# Patient Record
Sex: Female | Born: 1965
Health system: Southern US, Community
[De-identification: ages and names within clinical notes are randomized; demographics above are authoritative.]

## PROBLEM LIST (undated history)

## (undated) DIAGNOSIS — Z923 Personal history of irradiation: Secondary | ICD-10-CM

## (undated) DIAGNOSIS — I10 Essential (primary) hypertension: Secondary | ICD-10-CM

## (undated) DIAGNOSIS — C50919 Malignant neoplasm of unspecified site of unspecified female breast: Secondary | ICD-10-CM

## (undated) DIAGNOSIS — Z9221 Personal history of antineoplastic chemotherapy: Secondary | ICD-10-CM

## (undated) HISTORY — PX: PORTACATH PLACEMENT: SHX2246

---

## 2016-01-12 ENCOUNTER — Other Ambulatory Visit: Payer: Self-pay | Admitting: Family Medicine

## 2016-01-12 DIAGNOSIS — N63 Unspecified lump in unspecified breast: Secondary | ICD-10-CM

## 2016-01-19 ENCOUNTER — Other Ambulatory Visit: Payer: Self-pay | Admitting: Family Medicine

## 2016-01-19 ENCOUNTER — Ambulatory Visit
Admission: RE | Admit: 2016-01-19 | Discharge: 2016-01-19 | Disposition: A | Discharge status: Home / Self Care | Payer: Managed Care, Other (non HMO) | Source: Ambulatory Visit | Attending: Family Medicine | Admitting: Family Medicine

## 2016-01-19 DIAGNOSIS — N6310 Unspecified lump in the right breast, unspecified quadrant: Secondary | ICD-10-CM

## 2016-01-19 DIAGNOSIS — N63 Unspecified lump in unspecified breast: Secondary | ICD-10-CM

## 2016-01-20 ENCOUNTER — Ambulatory Visit
Admission: RE | Admit: 2016-01-20 | Discharge: 2016-01-20 | Disposition: A | Discharge status: Home / Self Care | Payer: Managed Care, Other (non HMO) | Source: Ambulatory Visit | Attending: Family Medicine | Admitting: Family Medicine

## 2016-01-20 ENCOUNTER — Other Ambulatory Visit: Payer: Self-pay | Admitting: Family Medicine

## 2016-01-20 DIAGNOSIS — N6310 Unspecified lump in the right breast, unspecified quadrant: Secondary | ICD-10-CM

## 2016-01-20 DIAGNOSIS — C50919 Malignant neoplasm of unspecified site of unspecified female breast: Secondary | ICD-10-CM

## 2016-01-20 HISTORY — DX: Malignant neoplasm of unspecified site of unspecified female breast: C50.919

## 2016-01-20 HISTORY — PX: BREAST BIOPSY: SHX20

## 2016-01-26 ENCOUNTER — Ambulatory Visit: Payer: Self-pay | Admitting: Surgery

## 2016-01-26 NOTE — H&P (Signed)
Elizabeth Cunningham 01/26/2016 2:13 PM Location: [facility] Patient #: [id number] DOB: 1966-07-04 Married / Language: [name] / Race: White Female  History of Present Illness [name] A. Amarachi Kotz MD; 01/26/2016 3:55 PM) Patient words: . Patient sent at the request of Dr. Augustin Coupe of the [facility] due to a right breast mass. This is been present for the last 6-7 weeks. Location is in the upper central right breast. It is nontender. Patient workup which included mammography with ultrasound which shows a 3 cm mass upper central breast on the right. Core biopsy was done which shows invasive ductal carcinoma ER negative PR negative HER-2/neu pending. She is a Ki-67 of 95%. Patient denies any history of breast cancer. She has no other complaints today.             ADDITIONAL INFORMATION: PROGNOSTIC INDICATORS Results: IMMUNOHISTOCHEMICAL AND MORPHOMETRIC ANALYSIS PERFORMED MANUALLY Estrogen Receptor: 0%, NEGATIVE Progesterone Receptor: 0%, NEGATIVE Proliferation Marker Ki67: 95% COMMENT: The negative hormone receptor study(ies) in this case has no internal positive control. REFERENCE RANGE ESTROGEN RECEPTOR NEGATIVE 0% POSITIVE =>1% REFERENCE RANGE PROGESTERONE RECEPTOR NEGATIVE 0% POSITIVE =>1% All controls stained appropriately Enid Cutter MD Pathologist, Electronic Signature ( Signed 01/24/2016) FINAL DIAGNOSIS Diagnosis Breast, right, needle core biopsy, 12:00 o'clock - INVASIVE DUCTAL CARCINOMA. - SEE COMMENT. 1 of 2 FINAL for Elizabeth Cunningham, Elizabeth Cunningham ([id number]) Microscopic Comment Although definitive grading of breast carcinoma is best done on excision, the features of the invasive tumor from the right breast 12 o'clock biopsy are compatible with a grade 3 breast carcinoma. Of note, there are features which raise concern for lymph/vascular invasion. Breast prognostic markers will be performed and reported in an addendum. Findings are called to the  [facility] on 01/23/16. Dr. Lyndon Code has seen this case in consultation with agreement. (RAH:gt, 01/23/16) Willeen Niece MD Pathologist, Electronic Signature     CLINICAL DATA: Palpable abnormality in the right breast. EXAM: 2D DIGITAL DIAGNOSTIC BILATERAL MAMMOGRAM WITH ADJUNCT TOMO ULTRASOUND RIGHT BREAST COMPARISON: 10/10/2015 ACR Breast Density Category c: The breast tissue is heterogeneously dense, which may obscure small masses. FINDINGS: A BB is placed in the area of patient's concern in the upper central portion of the right breast. Within this region there is irregular mass, partially obscured and further evaluated with ultrasound. Left breast is negative. On physical exam, I palpate a mass in the 12-12 30 o'clock location of the right breast, approximately 3 cm in diameter on physical exam. Targeted ultrasound is performed, showing an irregular hypoechoic mass in the 12:30 o'clock location of the right breast measuring 2.9 x 1.9 by 1.6 cm. Mass is taller than wide and contain significant vascularity on Doppler evaluation. Evaluation of the axilla is negative for adenopathy. IMPRESSION: Suspicious mass in the 12:30 o'clock location of the right breast. Ultrasound-guided core biopsy is recommended. No evidence for adenopathy. RECOMMENDATION: Biopsy is scheduled for the patient on 01/20/2016. I have discussed the findings and recommendations with the patient. Results were also provided in writing at the conclusion of the visit. If applicable, a reminder letter will be sent to the patient regarding the next appointment. BI-RADS CATEGORY 4: Suspicious. Electronically Signed By: Nolon Nations M.D. On: 01/19/2016 13:07 .  The patient is a 50 year old female.   Other Problems Elbert Ewings, CMA; 01/26/2016 2:13 PM) Breast Cancer High blood pressure Lump In Breast  Past Surgical History Elbert Ewings, CMA; 01/26/2016 2:13 PM) Breast Biopsy  Right.  Diagnostic Studies History Elbert Ewings, [place]; 01/26/2016 2:13 PM)  Colonoscopy never Mammogram within last year Pap Smear 1-5 years ago  Allergies Elbert Ewings, CMA; 01/26/2016 2:14 PM) No Known Drug Allergies 01/26/2016  Medication History Elbert Ewings, CMA; 01/26/2016 2:14 PM) Atenolol (25MG Tablet, Oral) Active. Multiple Vitamin (Oral) Active. Omega 3 (1000MG Capsule, Oral) Active. Medications Reconciled  Social History Elbert Ewings, [place]; 01/26/2016 2:13 PM) Alcohol use Occasional alcohol use. Caffeine use Coffee. No drug use Tobacco use Never smoker.  Pregnancy / Birth History Elbert Ewings, CMA; 01/26/2016 2:13 PM) Age at menarche 89 years.     Review of Systems Elbert Ewings CMA; 01/26/2016 2:13 PM) General Not Present- Appetite Loss, Chills, Fatigue, Fever, Night Sweats, Weight Gain and Weight Loss. Skin Not Present- Change in Wart/Mole, Dryness, Hives, Jaundice, New Lesions, Non-Healing Wounds, Rash and Ulcer. HEENT Present- Wears glasses/contact lenses. Not Present- Earache, Hearing Loss, Hoarseness, Nose Bleed, Oral Ulcers, Ringing in the Ears, Seasonal Allergies, Sinus Pain, Sore Throat, Visual Disturbances and Yellow Eyes. Respiratory Not Present- Bloody sputum, Chronic Cough, Difficulty Breathing, Snoring and Wheezing. Breast Present- Breast Mass. Not Present- Breast Pain, Nipple Discharge and Skin Changes. Cardiovascular Not Present- Chest Pain, Difficulty Breathing Lying Down, Leg Cramps, Palpitations, Rapid Heart Rate, Shortness of Breath and Swelling of Extremities. Gastrointestinal Not Present- Abdominal Pain, Bloating, Bloody Stool, Change in Bowel Habits, Chronic diarrhea, Constipation, Difficulty Swallowing, Excessive gas, Gets full quickly at meals, Hemorrhoids, Indigestion, Nausea, Rectal Pain and Vomiting. Female Genitourinary Not Present- Frequency, Nocturia, Painful Urination, Pelvic Pain and Urgency. Musculoskeletal Not Present- Back Pain,  Joint Pain, Joint Stiffness, Muscle Pain, Muscle Weakness and Swelling of Extremities. Neurological Not Present- Decreased Memory, Fainting, Headaches, Numbness, Seizures, Tingling, Tremor, Trouble walking and Weakness. Psychiatric Not Present- Anxiety, Bipolar, Change in Sleep Pattern, Depression, Fearful and Frequent crying. Endocrine Present- Hot flashes. Not Present- Cold Intolerance, Excessive Hunger, Hair Changes, Heat Intolerance and New Diabetes. Hematology Not Present- Easy Bruising, Excessive bleeding, Gland problems, HIV and Persistent Infections.  Vitals Elbert Ewings CMA; 01/26/2016 2:15 PM) 01/26/2016 2:14 PM Weight: 144.8 lb Height: 65in Body Surface Area: 1.72 m Body Mass Index: 24.1 kg/m  Temp.: 97.45F  Pulse: 80 (Regular)  BP: 122/68 (Sitting, Left Arm, Standard)      Physical Exam (Bannon Giammarco A. Jowanna Loeffler MD; 01/26/2016 3:56 PM)  General Mental Status-Alert. General Appearance-Consistent with stated age. Hydration-Well hydrated. Voice-Normal.  Head and Neck Head-normocephalic, atraumatic with no lesions or palpable masses. Trachea-midline. Thyroid Gland Characteristics - normal size and consistency.  Chest and Lung Exam Chest and lung exam reveals -quiet, even and easy respiratory effort with no use of accessory muscles and on auscultation, normal breath sounds, no adventitious sounds and normal vocal resonance. Inspection Chest Wall - Normal. Back - normal.  Breast Note: Right breast shows 3 cm mobile mass central upper breast. No other masses. Left breast shows no mass lesion. There is no evidence of nipple discharge bilaterally.  Cardiovascular Cardiovascular examination reveals -normal heart sounds, regular rate and rhythm with no murmurs and normal pedal pulses bilaterally.  Musculoskeletal Normal Exam - Left-Upper Extremity Strength Normal and Lower Extremity Strength Normal. Normal Exam - Right-Upper Extremity Strength  Normal and Lower Extremity Strength Normal.  Lymphatic Axillary  General Axillary Region: Bilateral - Description - Normal. Tenderness - Non Tender.    Assessment & Plan (Shemeca Lukasik A. Fredrica Capano MD; 01/26/2016 3:58 PM)  BREAST CANCER, RIGHT (C50.911) Impression: Discussed treatment options with the patient to include breast conservation versus mastectomy with reconstruction. She is triple negative more than likely and may need medical oncology input  prior to any surgery. She may need postoperative chemotherapy as well. She is a candidate at this point for lumpectomy with sentinel lymph node mapping with minimal cosmetic deformity noted. She may be a better candidate for neoadjuvant chemotherapy upfront and subsequent surgery down the road. I discussed the role of Port-A-Cath placement with her as well. A wedge of the risk of this as well as lumpectomy with sentinel lymph node mapping as well. Also discussed vasectomies of treatment option with reconstruction with potential nipple preservation. She will see medical radiation collagen and future plans will be determined once these consultants of seeing her and have given their opinions Risk of lumpectomy include bleeding, infection, seroma, more surgery, use of seed/wire, wound care, cosmetic deformity and the need for other treatments, death , blood clots, death. Pt agrees to proceed. Risk of sentinel lymph node mapping include bleeding, infection, lymphedema, shoulder pain. stiffness, dye allergy. cosmetic deformity , blood clots, death, need for more surgery. Pt agres to proceed. Pt requires port placement for chemotherapy. Risk include bleeding, infection, pneumothorax, hemothorax, mediastinal injury, nerve injury , blood vessel injury, strke, blood clots, death, migration. embolization and need for additional procedures. Pt agrees to proceed.  Current Plans Referred to Oncology, for evaluation and follow up (Oncology). Routine. Referred to Radiation  Oncology, for evaluation and follow up (Radiation Oncology). Routine. Pt Education - [facility] Breast Cancer Information Given - [facility] "Breast Journey" Package We discussed the staging and pathophysiology of breast cancer. We discussed all of the different options for treatment for breast cancer including surgery, chemotherapy, radiation therapy, Herceptin, and antiestrogen therapy. We discussed a sentinel lymph node biopsy as she does not appear to having lymph node involvement right now. We discussed the performance of that with injection of radioactive tracer and blue dye. We discussed that she would have an incision underneath her axillary hairline. We discussed that there is a bout a 10-20% chance of having a positive node with a sentinel lymph node biopsy and we will await the permanent pathology to make any other first further decisions in terms of her treatment. One of these options might be to return to the operating room to perform an axillary lymph node dissection. We discussed about a 1-2% risk lifetime of chronic shoulder pain as well as lymphedema associated with a sentinel lymph node biopsy. We discussed the options for treatment of the breast cancer which included lumpectomy versus a mastectomy. We discussed the performance of the lumpectomy with a wire placement. We discussed a 10-20% chance of a positive margin requiring reexcision in the operating room. We also discussed that she may need radiation therapy or antiestrogen therapy or both if she undergoes lumpectomy. We discussed the mastectomy and the postoperative care for that as well. We discussed that there is no difference in her survival whether she undergoes lumpectomy with radiation therapy or antiestrogen therapy versus a mastectomy. There is a slight difference in the local recurrence rate being 3-5% with lumpectomy and about 1% with a mastectomy. We discussed the risks of operation including bleeding, infection, possible reoperation.  She understands her further therapy will be based on what her stages at the time of her operation.  Pt Education - [facility] breast cancer surgery: discussed with patient and provided information. Pt Education - [facility] Breast Biopsy HCI: discussed with patient and provided information. Pt Education - [facility] Mastectomy HCI Pt Education - ABC (After Breast Cancer) Class Info: discussed with patient and provided information.

## 2016-01-27 ENCOUNTER — Encounter: Payer: Self-pay | Admitting: Hematology and Oncology

## 2016-01-27 ENCOUNTER — Telehealth: Payer: Self-pay | Admitting: Hematology and Oncology

## 2016-01-27 ENCOUNTER — Encounter: Payer: Self-pay | Admitting: Radiation Oncology

## 2016-01-27 NOTE — Telephone Encounter (Signed)
Spoke with patient re new patient appointment with [facility] 01/30/2016 @ 1 pm to arrive 12:30 pm. Patient demographic and insurance information confirmed.

## 2016-01-30 ENCOUNTER — Encounter: Payer: Self-pay | Admitting: *Deleted

## 2016-01-30 ENCOUNTER — Ambulatory Visit: Payer: Self-pay | Admitting: Surgery

## 2016-01-30 ENCOUNTER — Encounter: Payer: Self-pay | Admitting: Hematology and Oncology

## 2016-01-30 ENCOUNTER — Ambulatory Visit (HOSPITAL_BASED_OUTPATIENT_CLINIC_OR_DEPARTMENT_OTHER): Payer: Managed Care, Other (non HMO) | Admitting: Hematology and Oncology

## 2016-01-30 ENCOUNTER — Telehealth: Payer: Self-pay | Admitting: Hematology and Oncology

## 2016-01-30 VITALS — BP 167/90 | HR 78 | Temp 99.2°F | Resp 18 | Ht 65.0 in | Wt 145.9 lb

## 2016-01-30 DIAGNOSIS — C50211 Malignant neoplasm of upper-inner quadrant of right female breast: Secondary | ICD-10-CM | POA: Diagnosis not present

## 2016-01-30 MED ORDER — LORAZEPAM 0.5 MG PO TABS: 0.5000 mg | ORAL_TABLET | Freq: Every day | ORAL | Status: DC

## 2016-01-30 MED ORDER — PROCHLORPERAZINE MALEATE 10 MG PO TABS: 10.0000 mg | ORAL_TABLET | Freq: Four times a day (QID) | ORAL | Status: DC | PRN

## 2016-01-30 MED ORDER — ONDANSETRON HCL 8 MG PO TABS: 8.0000 mg | ORAL_TABLET | Freq: Two times a day (BID) | ORAL | Status: DC | PRN

## 2016-01-30 MED ORDER — DEXAMETHASONE 4 MG PO TABS: 4.0000 mg | ORAL_TABLET | Freq: Every day | ORAL | Status: DC

## 2016-01-30 MED ORDER — LIDOCAINE-PRILOCAINE 2.5-2.5 % EX CREA: TOPICAL_CREAM | CUTANEOUS | Status: DC

## 2016-01-30 NOTE — Progress Notes (Signed)
Information for PREVENT trial given to pt for review.  Pt instructed that a research RN will be contacting her.  Should pt have any questions in the mean time, pt informed to call us at any time.  No further questions or concerns at time of discharge.

## 2016-01-30 NOTE — Progress Notes (Signed)
[facility] CONSULT NOTE  Consulting physician Dr. Brantley Stage CHIEF COMPLAINTS/PURPOSE OF CONSULTATION:  Newly diagnosed breast cancer  HISTORY OF PRESENTING ILLNESS:  Elizabeth Cunningham 50 y.o. female is here because of recent diagnosis of right breast cancer. Patient had abnormal routine screening mammogram in December 2016. She subsequently felt a lump in the right breast and came back for another set of mammograms at the request of Dr. Augustin Coupe. This set up a mammogram and ultrasound that revealed a 2.9 cm mass in the right breast. Core biopsy done revealed invasive ductal carcinoma that was triple negative with a Ki-67 of 95%. She was seen by Dr. Brantley Stage and is here today to discuss treatment plan. She works in the billing department previously and her husband works at [employer], Building services engineer. He used to work for [employer].  I reviewed her records extensively and collaborated the history with the patient.  SUMMARY OF ONCOLOGIC HISTORY:   Breast cancer of upper-inner quadrant of right female breast ([place])   01/20/2016 Initial Diagnosis Right breast biopsy 12:00: Invasive ductal carcinoma, grade 3, lymphovascular invasion is present, ER 0%, PR 0%, Ki-67 95%, HER-2 negative ratio 1.19; mamm and [place] irregular mass 2.9 x 1.9 x 1.6 cm, no axillary LN T2 N0 stage IIA clinical stage   MEDICAL HISTORY:  Hypertension SURGICAL HISTORY: No prior surgeries SOCIAL HISTORY:denies any tobacco alcohol or recreational drug use. They moved from [place] to [place] due to her husband's work.Previously she worked in Press photographer. FAMILY HISTORY: Patient was adopted and hence there is no family history available ALLERGIES:  has no allergies on file. MEDICATIONS:  Atenolol 25 mg daily, multivitamin 1 tablet daily, omega-3 1000 mg capsule daily  REVIEW OF SYSTEMS:   Constitutional: Denies fevers, chills or abnormal night sweats Eyes: Denies blurriness of vision, double vision or watery  eyes Ears, nose, mouth, throat, and face: Denies mucositis or sore throat Respiratory: Denies cough, dyspnea or wheezes Cardiovascular: Denies palpitation, chest discomfort or lower extremity swelling Gastrointestinal:  Denies nausea, heartburn or change in bowel habits Skin: Denies abnormal skin rashes Lymphatics: Denies new lymphadenopathy or easy bruising Neurological:Denies numbness, tingling or new weaknesses Behavioral/Psych: Mood is stable, no new changes  Breast: Palpable lump in the right breast upper outer quadrant All other systems were reviewed with the patient and are negative.  PHYSICAL EXAMINATION: ECOG PERFORMANCE STATUS: 0 - Asymptomatic  Filed Vitals:   01/30/16 1337  BP: 167/90  Pulse: 78  Temp: 99.2 F (37.3 C)  Resp: 18   Filed Weights   01/30/16 1337  Weight: 145 lb 14.4 oz (66.18 kg)    GENERAL:alert, no distress and comfortable SKIN: skin color, texture, turgor are normal, no rashes or significant lesions EYES: normal, conjunctiva are pink and non-injected, sclera clear OROPHARYNX:no exudate, no erythema and lips, buccal mucosa, and tongue normal  NECK: supple, thyroid normal size, non-tender, without nodularity LYMPH:  no palpable lymphadenopathy in the cervical, axillary or inguinal LUNGS: clear to auscultation and percussion with normal breathing effort HEART: regular rate & rhythm and no murmurs and no lower extremity edema ABDOMEN:abdomen soft, non-tender and normal bowel sounds Musculoskeletal:no cyanosis of digits and no clubbing  PSYCH: alert & oriented x 3 with fluent speech NEURO: no focal motor/sensory deficits BREAST:arge palpable lump in the right breast upper outer quadrant with small hematoma. No palpable axillary or supraclavicular lymphadenopathy (exam performed in the presence of a chaperone)   RADIOGRAPHIC STUDIES: I have personally reviewed the radiological reports and agreed with the findings  in the report.  ASSESSMENT AND  PLAN:  Breast cancer of upper-inner quadrant of right female breast ([place]) Right breast biopsy 01/20/2016 12:00: Invasive ductal carcinoma, grade 3, lymphovascular invasion is present, ER 0%, PR 0%, Ki-67 95%, HER-2 negative ratio 1.19; mamm and [place] irregular mass 2.9 x 1.9 x 1.6 cm, no axillary LN T2 N0 stage IIA clinical stage  Pathology and radiology counseling: Discussed with the patient, the details of pathology including the type of breast cancer,the clinical staging, the significance of ER, PR and HER-2/neu receptors and the implications for treatment. After reviewing the pathology in detail, we proceeded to discuss the different treatment options between surgery, radiation, chemotherapy, antiestrogen therapies.  Recommendation based on multidisciplinary tumor board: 1. Neoadjuvant chemotherapy with Adriamycin and Cytoxan dose dense 4 followed by Abraxane weekly 12 2. Followed by breast conserving surgery with sentinel lymph node study vs bilateral mastectomies. (patient appears to be very worried that the mammograms did not pick up her breast cancer with mammograms may not be sufficient) 3. Followed by adjuvant radiation therapy 4. Genetic counseling and testing  Chemotherapy Counseling: I discussed the risks and benefits of chemotherapy including the risks of nausea/ vomiting, risk of infection from low WBC count, fatigue due to chemo or anemia, bruising or bleeding due to low platelets, mouth sores, loss/ change in taste and decreased appetite. Liver and kidney function will be monitored through out chemotherapy as abnormalities in liver and kidney function may be a side effect of treatment. Cardiac dysfunction due to Adriamycin was discussed in detail. Risk of permanent bone marrow dysfunction due to chemo were also discussed.  Plan: 1. Port placement 2. Echocardiogram 3. Chemotherapy class 4. Breast MRI Genetic counseling will also be arranged   PREVENT trial: [id number] [phone]  Newly  diagnosed breast cancer Stages 1-3 scheduled to receive anthracycline randomized to atorvastatin 40 mg or placebo once daily for 24 months along with 3 cardiac MRIs or 2 years paid for by the trial. I discussed the risks and benefits of atorvastatin including the risk of myopathy and elevation of liver function tests. I explained the randomization process and patient is interested in the trial. I provided her with literature to read and provided her with the contact with the clinical trials nurse to ask any questions regarding the trial.  Return to clinic with day 1 of chemotherapy. All questions were answered. The patient knows to call the clinic with any problems, questions or concerns.    Rulon Eisenmenger, MD 01/30/2016

## 2016-01-30 NOTE — Telephone Encounter (Signed)
appt made and avs printed. Ov/mri and echo to be schedulaed

## 2016-01-30 NOTE — Assessment & Plan Note (Signed)
Right breast biopsy 01/20/2016 12:00: Invasive ductal carcinoma, grade 3, lymphovascular invasion is present, ER 0%, PR 0%, Ki-67 95%, HER-2 negative ratio 1.19; mamm and [place] irregular mass 2.9 x 1.9 x 1.6 cm, no axillary LN T2 N0 stage IIA clinical stage  Pathology and radiology counseling: Discussed with the patient, the details of pathology including the type of breast cancer,the clinical staging, the significance of ER, PR and HER-2/neu receptors and the implications for treatment. After reviewing the pathology in detail, we proceeded to discuss the different treatment options between surgery, radiation, chemotherapy, antiestrogen therapies.  Recommendation based on multidisciplinary tumor board: 1. Neoadjuvant chemotherapy with Adriamycin and Cytoxan dose dense 4 followed by Abraxane weekly 12 2. Followed by breast conserving surgery with sentinel lymph node study vs targeted axillary dissection 3. Followed by adjuvant radiation therapy 4. Genetic counseling and testing  Chemotherapy Counseling: I discussed the risks and benefits of chemotherapy including the risks of nausea/ vomiting, risk of infection from low WBC count, fatigue due to chemo or anemia, bruising or bleeding due to low platelets, mouth sores, loss/ change in taste and decreased appetite. Liver and kidney function will be monitored through out chemotherapy as abnormalities in liver and kidney function may be a side effect of treatment. Cardiac dysfunction due to Adriamycin was discussed in detail. Risk of permanent bone marrow dysfunction due to chemo were also discussed.  Plan: 1. Port placement 2. Echocardiogram 3. Chemotherapy class 4. Breast MRI Genetic counseling will also be arranged   PREVENT trial: [id number] [phone]  Newly diagnosed breast cancer Stages 1-3 scheduled to receive anthracycline randomized to atorvastatin 40 mg or placebo once daily for 24 months along with 3 cardiac MRIs or 2 years paid for by the trial. I  discussed the risks and benefits of atorvastatin including the risk of myopathy and elevation of liver function tests. I explained the randomization process and patient is interested in the trial. I provided her with literature to read and provided her with the contact with the clinical trials nurse to ask any questions regarding the trial.  Return to clinic with day 1 of chemotherapy.

## 2016-01-31 ENCOUNTER — Other Ambulatory Visit: Payer: Self-pay

## 2016-01-31 ENCOUNTER — Encounter: Payer: Managed Care, Other (non HMO) | Admitting: *Deleted

## 2016-01-31 ENCOUNTER — Other Ambulatory Visit: Payer: Self-pay | Admitting: *Deleted

## 2016-01-31 ENCOUNTER — Telehealth: Payer: Self-pay | Admitting: *Deleted

## 2016-01-31 ENCOUNTER — Encounter: Payer: Self-pay | Admitting: *Deleted

## 2016-01-31 ENCOUNTER — Other Ambulatory Visit (HOSPITAL_COMMUNITY): Payer: Self-pay | Admitting: Surgery

## 2016-01-31 ENCOUNTER — Other Ambulatory Visit: Payer: Managed Care, Other (non HMO)

## 2016-01-31 DIAGNOSIS — C50211 Malignant neoplasm of upper-inner quadrant of right female breast: Secondary | ICD-10-CM

## 2016-01-31 DIAGNOSIS — C50911 Malignant neoplasm of unspecified site of right female breast: Secondary | ICD-10-CM

## 2016-01-31 MED ORDER — LIDOCAINE-PRILOCAINE 2.5-2.5 % EX CREA: TOPICAL_CREAM | CUTANEOUS | Status: DC

## 2016-01-31 MED ORDER — LORAZEPAM 0.5 MG PO TABS: 0.5000 mg | ORAL_TABLET | Freq: Every day | ORAL | Status: DC

## 2016-01-31 MED ORDER — DEXAMETHASONE 4 MG PO TABS: 4.0000 mg | ORAL_TABLET | Freq: Every day | ORAL | Status: DC

## 2016-01-31 MED ORDER — ONDANSETRON HCL 8 MG PO TABS: 8.0000 mg | ORAL_TABLET | Freq: Two times a day (BID) | ORAL | Status: DC | PRN

## 2016-01-31 MED ORDER — PROCHLORPERAZINE MALEATE 10 MG PO TABS: 10.0000 mg | ORAL_TABLET | Freq: Four times a day (QID) | ORAL | Status: DC | PRN

## 2016-01-31 NOTE — Telephone Encounter (Signed)
Pt called to relate she is going out of town on 5/25 and will be back on 5/30.  POF sent to move cycle 1 AC to 5/31 with lab and Dr. [name] appt.

## 2016-01-31 NOTE — Telephone Encounter (Signed)
Called pt to find out which pharmacy she preferred.  Notified pt that all prescriptions would be called in today to [facility] and she could pick these up at her convenience.  No further questions from pt at time of call.

## 2016-02-01 ENCOUNTER — Encounter: Payer: Managed Care, Other (non HMO) | Admitting: *Deleted

## 2016-02-01 ENCOUNTER — Other Ambulatory Visit: Payer: Managed Care, Other (non HMO)

## 2016-02-02 ENCOUNTER — Other Ambulatory Visit: Payer: Self-pay | Admitting: Radiology

## 2016-02-02 ENCOUNTER — Telehealth: Payer: Self-pay | Admitting: Hematology and Oncology

## 2016-02-02 ENCOUNTER — Telehealth: Payer: Self-pay | Admitting: *Deleted

## 2016-02-02 ENCOUNTER — Encounter: Payer: Self-pay | Admitting: Hematology and Oncology

## 2016-02-02 NOTE — Telephone Encounter (Signed)
Per staff message and POF I have scheduled appts. Advised scheduler of appts. JMW  

## 2016-02-02 NOTE — Progress Notes (Signed)
Called patient to introduce myself as her Estate manager/land agent and ask if she had any financial questions or concerns. Patient wanted to know if her chemo had been authorized. Advised patient she would be notified if it had not been prior to her treatment. Asked patient if she had met her deductible or OOP. Patient states she had not yet but still had charges that had not been processed through her insurance. Advised her of the [employer] First Step program for Neulasta and how it works and also that she could apply if needed after her other insurance payments process. Gave patient my name and contact number should she have any additional financial questions or concerns.

## 2016-02-02 NOTE — Telephone Encounter (Signed)
Spoke with patient to confirm echo appt 5/22 and ov/lab/tx appt 6/1 at 1115 am

## 2016-02-03 ENCOUNTER — Other Ambulatory Visit (HOSPITAL_COMMUNITY): Payer: Self-pay | Admitting: Surgery

## 2016-02-03 ENCOUNTER — Encounter (HOSPITAL_COMMUNITY): Payer: Self-pay

## 2016-02-03 ENCOUNTER — Ambulatory Visit (HOSPITAL_COMMUNITY)
Admission: RE | Admit: 2016-02-03 | Discharge: 2016-02-03 | Disposition: A | Discharge status: Home / Self Care | Payer: Managed Care, Other (non HMO) | Source: Ambulatory Visit | Attending: Surgery | Admitting: Surgery

## 2016-02-03 DIAGNOSIS — C50911 Malignant neoplasm of unspecified site of right female breast: Secondary | ICD-10-CM

## 2016-02-03 DIAGNOSIS — Z79899 Other long term (current) drug therapy: Secondary | ICD-10-CM | POA: Diagnosis not present

## 2016-02-03 HISTORY — DX: Malignant neoplasm of unspecified site of unspecified female breast: C50.919

## 2016-02-03 LAB — CBC WITH DIFFERENTIAL/PLATELET
Basophils Absolute: 0 10*3/uL (ref 0.0–0.1)
Basophils Relative: 0 %
EOS ABS: 0 10*3/uL (ref 0.0–0.7)
EOS PCT: 0 %
HCT: 36.1 % (ref 36.0–46.0)
Hemoglobin: 12.2 g/dL (ref 12.0–15.0)
LYMPHS ABS: 1.9 10*3/uL (ref 0.7–4.0)
Lymphocytes Relative: 21 %
MCH: 29.6 pg (ref 26.0–34.0)
MCHC: 33.8 g/dL (ref 30.0–36.0)
MCV: 87.6 fL (ref 78.0–100.0)
MONOS PCT: 5 %
Monocytes Absolute: 0.5 10*3/uL (ref 0.1–1.0)
Neutro Abs: 6.6 10*3/uL (ref 1.7–7.7)
Neutrophils Relative %: 74 %
PLATELETS: 284 10*3/uL (ref 150–400)
RBC: 4.12 MIL/uL (ref 3.87–5.11)
RDW: 13.6 % (ref 11.5–15.5)
WBC: 9 10*3/uL (ref 4.0–10.5)

## 2016-02-03 LAB — BASIC METABOLIC PANEL
Anion gap: 11 (ref 5–15)
BUN: 12 mg/dL (ref 6–20)
CHLORIDE: 102 mmol/L (ref 101–111)
CO2: 25 mmol/L (ref 22–32)
CREATININE: 0.68 mg/dL (ref 0.44–1.00)
Calcium: 9.9 mg/dL (ref 8.9–10.3)
GFR calc Af Amer: >60 mL/min (ref >60)
GFR calc non Af Amer: >60 mL/min (ref >60)
Glucose, Bld: 125 mg/dL — ABNORMAL HIGH (ref 65–99)
Potassium: 5.5 mmol/L — ABNORMAL HIGH (ref 3.5–5.1)
SODIUM: 138 mmol/L (ref 135–145)

## 2016-02-03 LAB — PROTIME-INR
INR: 1.06 (ref 0.00–1.49)
PROTHROMBIN TIME: 14 s (ref 11.6–15.2)

## 2016-02-03 MED ORDER — MIDAZOLAM HCL 2 MG/2ML IJ SOLN
INTRAMUSCULAR | Status: AC
  Filled 2016-02-03: qty 4

## 2016-02-03 MED ORDER — CEFAZOLIN SODIUM-DEXTROSE 2-4 GM/100ML-% IV SOLN
2.0000 g | INTRAVENOUS | Status: AC
  Administered 2016-02-03: 2 g via INTRAVENOUS

## 2016-02-03 MED ORDER — CEFAZOLIN SODIUM-DEXTROSE 2-4 GM/100ML-% IV SOLN
INTRAVENOUS | Status: AC
  Filled 2016-02-03: qty 100

## 2016-02-03 MED ORDER — FENTANYL CITRATE (PF) 100 MCG/2ML IJ SOLN
INTRAMUSCULAR | Status: AC | PRN
  Administered 2016-02-03: 25 ug via INTRAVENOUS
  Administered 2016-02-03: 50 ug via INTRAVENOUS
  Administered 2016-02-03: 25 ug via INTRAVENOUS

## 2016-02-03 MED ORDER — MIDAZOLAM HCL 2 MG/2ML IJ SOLN
INTRAMUSCULAR | Status: AC | PRN
  Administered 2016-02-03: 1 mg via INTRAVENOUS
  Administered 2016-02-03: 0.5 mg via INTRAVENOUS
  Administered 2016-02-03: 1 mg via INTRAVENOUS

## 2016-02-03 MED ORDER — SODIUM CHLORIDE 0.9 % IV SOLN
INTRAVENOUS | Status: DC
  Administered 2016-02-03: 12:00:00 via INTRAVENOUS

## 2016-02-03 MED ORDER — LIDOCAINE-EPINEPHRINE (PF) 1 %-1:200000 IJ SOLN
INTRAMUSCULAR | Status: DC
Start: 2016-02-03 — End: 2016-02-04
  Filled 2016-02-03: qty 30

## 2016-02-03 MED ORDER — HEPARIN SOD (PORK) LOCK FLUSH 100 UNIT/ML IV SOLN
INTRAVENOUS | Status: AC
  Filled 2016-02-03: qty 10

## 2016-02-03 MED ORDER — FENTANYL CITRATE (PF) 100 MCG/2ML IJ SOLN
INTRAMUSCULAR | Status: AC
  Filled 2016-02-03: qty 4

## 2016-02-03 NOTE — Progress Notes (Signed)
Location of Breast Cancer:Upper-inner quadrant of right female breast  Histology per Pathology Report: Diagnosis 01-20-16 Breast, right, needle core biopsy, 12:00 o'clock - INVASIVE DUCTAL CARCINOMA. Receptor Status: ER(0), PR (0), Her2-neu (-), Ki-(95%)  Elizabeth Cunningham  felt a lump in the right breast and came back for another set of mammograms.  Past/Anticipated interventions by surgeon, if any: To be scheduled after chemotherapy  Past/Anticipated interventions by medical oncology, if QWQ:[id number] with Adriamycin and Cytoxan dose dense 4 followed by Abraxane weekly 12 ,01-20-16 Right breast needle core biopsy  Lymphedema issues, if QUI:VHOY  Pain issues, if any: No SAFETY ISSUES:  Prior radiation? : No  Pacemaker/ICD? :  Possible current pregnancy?:No  Is the patient on methotrexate?:No  Current Complaints / other details:Here to discuss radiation treatment Menarche 9,G2,P2,BC  None,Menopause 49,HRT No   BP 146/77 mmHg  Pulse 87  Temp(Src) 98.4 F (36.9 C) (Oral)  Resp 16  Ht '5\' 5"'  (1.651 m)  Wt 148 lb (67.132 kg)  BMI 24.63 kg/m2  SpO2 100%   Elizabeth Spurling, RN 02/03/2016,4:53 PM

## 2016-02-03 NOTE — Discharge Instructions (Signed)
Implanted Port Insertion, Care After °Refer to this sheet in the next few weeks. These instructions provide you with information on caring for yourself after your procedure. Your health care provider may also give you more specific instructions. Your treatment has been planned according to current medical practices, but problems sometimes occur. Call your health care provider if you have any problems or questions after your procedure. °WHAT TO EXPECT AFTER THE PROCEDURE °After your procedure, it is typical to have the following:  °· Discomfort at the port insertion site. Ice packs to the area will help. °· Bruising on the skin over the port. This will subside in 3-4 days. °HOME CARE INSTRUCTIONS °· After your port is placed, you will get a manufacturer's information card. The card has information about your port. Keep this card with you at all times.   °· Know what kind of port you have. There are many types of ports available.   °· Wear a medical alert bracelet in case of an emergency. This can help alert health care workers that you have a port.   °· The port can stay in for as long as your health care provider believes it is necessary.   °· A home health care nurse may give medicines and take care of the port.   °· You or a family member can get special training and directions for giving medicine and taking care of the port at home.   °SEEK MEDICAL CARE IF:  °· Your port does not flush or you are unable to get a blood return.   °· You have a fever or chills. °SEEK IMMEDIATE MEDICAL CARE IF: °· You have new fluid or pus coming from your incision.   °· You notice a bad smell coming from your incision site.   °· You have swelling, pain, or more redness at the incision or port site.   °· You have chest pain or shortness of breath. °  °This information is not intended to replace advice given to you by your health care provider. Make sure you discuss any questions you have with your health care provider. °  °Document  Released: 06/24/2013 Document Revised: 09/08/2013 Document Reviewed: 06/24/2013 °Elsevier Interactive Patient Education ©2016 [employer]. ° °

## 2016-02-03 NOTE — Sedation Documentation (Signed)
Patient is resting comfortably. 

## 2016-02-03 NOTE — Procedures (Signed)
Successful placement of left IJ approach port-a-cath with tip at the superior caval atrial junction. The catheter is ready for immediate use. Estimated Blood Loss: Minimal No immediate post procedural complications.  Jay Safiatou Islam, MD Pager #: [phone]   

## 2016-02-03 NOTE — Sedation Documentation (Signed)
Denies pain

## 2016-02-03 NOTE — H&P (Signed)
Chief Complaint: breast cancer  Referring Physician:Dr. Erroll Luna  Supervising Physician: Sandi Mariscal  Patient Status: Out-pt  HPI: Elizabeth Cunningham is an 50 y.o. female who was recently diagnosed with breast cancer.  She is followed by Dr. [name].  She will start chemotherapy on June1.  A request has been made for a port a cath to be placed.  She has no other complaints and is feeling otherwise well.  Past Medical History:  Past Medical History  Diagnosis Date  . Breast cancer [facility])     Past Surgical History: History reviewed. No pertinent past surgical history.  Family History: History reviewed. No pertinent family history.  Social History:  has no tobacco, alcohol, and drug history on file.  Allergies: No Known Allergies  Medications:   Medication List    ASK your doctor about these medications        atenolol 25 MG tablet  Commonly known as:  TENORMIN  Take 25 mg by mouth daily.     CO Q 10 PO  Take 1 capsule by mouth daily.     dexamethasone 4 MG tablet  Commonly known as:  DECADRON  Take 1 tablet (4 mg total) by mouth daily. Take 1 tablet twice daily for 2 days     lidocaine-prilocaine cream  Commonly known as:  EMLA  Apply to affected area once     LORazepam 0.5 MG tablet  Commonly known as:  ATIVAN  Take 1 tablet (0.5 mg total) by mouth at bedtime.     multivitamin with minerals tablet  Take 1 tablet by mouth daily.     Omega 3 1000 MG Caps  Take 2 capsules by mouth daily.     ondansetron 8 MG tablet  Commonly known as:  ZOFRAN  Take 1 tablet (8 mg total) by mouth 2 (two) times daily as needed. Start on the third day after chemotherapy.     prochlorperazine 10 MG tablet  Commonly known as:  COMPAZINE  Take 1 tablet (10 mg total) by mouth every 6 (six) hours as needed (Nausea or vomiting).        Please HPI for pertinent positives, otherwise complete 10 system ROS negative.  Mallampati Score: MD Evaluation Airway: WNL Heart:  WNL Abdomen: WNL Chest/ Lungs: WNL ASA  Classification: 2 Mallampati/Airway Score: Two  Physical Exam: There were no vitals taken for this visit. There is no weight on file to calculate BMI. General: pleasant, WD, WN white female who is laying in bed in NAD HEENT: head is normocephalic, atraumatic.  Sclera are noninjected.  PERRL.  Ears and nose without any masses or lesions.  Mouth is pink and moist Heart: regular, rate, and rhythm.  Normal s1,s2. No obvious murmurs, gallops, or rubs noted.  Palpable radial and pedal pulses bilaterally Lungs: CTAB, no wheezes, rhonchi, or rales noted.  Respiratory effort nonlabored Abd: soft, NT, ND, +BS, no masses, hernias, or organomegaly MS: all 4 extremities are symmetrical with no cyanosis, clubbing, or edema. Psych: A&Ox3 with an appropriate affect.   Labs: No results found for this or any previous visit (from the past 48 hour(s)).  Imaging: No results found.  Assessment/Plan 1. Right breast cancer -will plan for placement of a port a cath today so the patient may initiate chemotherapy in a couple of weeks. -she has been NPO and does not take any blood thinners -Risks and Benefits discussed with the patient including, but not limited to bleeding, infection, pneumothorax, or fibrin sheath development  and need for additional procedures. All of the patient's questions were answered, patient is agreeable to proceed. Consent signed and in chart.   Thank you for this interesting consult.  I greatly enjoyed meeting Elizabeth Cunningham and look forward to participating in their care.  A copy of this report was sent to the requesting provider on this date.  Electronically Signed: Henreitta Cea 02/03/2016, 11:34 AM   I spent a total of  30 Minutes  in face to face in clinical consultation, greater than 50% of which was counseling/coordinating care for right breast cancer, needs PAC

## 2016-02-06 ENCOUNTER — Ambulatory Visit (HOSPITAL_COMMUNITY)
Admission: RE | Admit: 2016-02-06 | Discharge: 2016-02-06 | Disposition: A | Discharge status: Home / Self Care | Payer: Managed Care, Other (non HMO) | Source: Ambulatory Visit | Attending: Hematology and Oncology | Admitting: Hematology and Oncology

## 2016-02-06 DIAGNOSIS — I517 Cardiomegaly: Secondary | ICD-10-CM | POA: Insufficient documentation

## 2016-02-06 DIAGNOSIS — C50211 Malignant neoplasm of upper-inner quadrant of right female breast: Secondary | ICD-10-CM | POA: Insufficient documentation

## 2016-02-06 DIAGNOSIS — I340 Nonrheumatic mitral (valve) insufficiency: Secondary | ICD-10-CM | POA: Diagnosis not present

## 2016-02-06 NOTE — Progress Notes (Signed)
  Echocardiogram 2D Echocardiogram has been performed.  Bobbye Charleston 02/06/2016, 9:48 AM

## 2016-02-07 ENCOUNTER — Ambulatory Visit
Admission: RE | Admit: 2016-02-07 | Discharge: 2016-02-07 | Disposition: A | Discharge status: Home / Self Care | Payer: Managed Care, Other (non HMO) | Source: Ambulatory Visit | Attending: Hematology and Oncology | Admitting: Hematology and Oncology

## 2016-02-07 DIAGNOSIS — C50211 Malignant neoplasm of upper-inner quadrant of right female breast: Secondary | ICD-10-CM

## 2016-02-07 MED ORDER — GADOBENATE DIMEGLUMINE 529 MG/ML IV SOLN
13.0000 mL | Freq: Once | INTRAVENOUS | Status: AC | PRN
  Administered 2016-02-07: 13 mL via INTRAVENOUS

## 2016-02-08 ENCOUNTER — Ambulatory Visit
Admission: RE | Admit: 2016-02-08 | Discharge: 2016-02-08 | Disposition: A | Discharge status: Home / Self Care | Payer: Managed Care, Other (non HMO) | Source: Ambulatory Visit | Attending: Radiation Oncology | Admitting: Radiation Oncology

## 2016-02-08 VITALS — BP 146/77 | HR 87 | Temp 98.4°F | Resp 16 | Ht 65.0 in | Wt 148.0 lb

## 2016-02-08 DIAGNOSIS — C50211 Malignant neoplasm of upper-inner quadrant of right female breast: Secondary | ICD-10-CM | POA: Diagnosis present

## 2016-02-08 NOTE — Progress Notes (Signed)
  Radiation Oncology         [phone]) [phone] ________________________________  Initial Outpatient Consultation - Date: 02/08/2016   Name: Elizabeth Cunningham MRN: [id number]   DOB: 06-18-1966  REFERRING PHYSICIAN: Erroll Luna, MD  DIAGNOSIS AND STAGE: No matching staging information was found for the patient.  Breast cancer of the upper-inner quadrant of the right female breast.  HISTORY OF PRESENT ILLNESS::Elizabeth Cunningham is a 50 y.o. female who presents today for initial consultation. She had an abnormality of on her routine screening mammogram in December 2016 and felt a lump in the right breast and came back for another set of mammograms 01/19/2016. She had a biopsy 01/20/2016, revealing invasive ductal carcinoma, ER (0%), PR (90%), Her2-neu negative, and Ki 67 (95%). She is currently receiving chemotherapy, Adriamycin and Cytoxan dose dense x 4 followed by Abraxane weekly x 12.  She mentions that she has lost 40 pounds in the past couple months. She denies lymphedema or pain. She is accompanied by her husband. She has been referred to genetics.  She is GXP2.  Her husband used to work for [employer].   PREVIOUS RADIATION THERAPY: No  Past medical, social and family history were reviewed in the electronic chart. Review of symptoms was reviewed in the electronic chart. Medications were reviewed in the electronic chart.   PHYSICAL EXAM:  Filed Vitals:   02/08/16 1523  BP: 146/77  Pulse: 87  Temp: 98.4 F (36.9 C)  Resp: 16  .148 lb (67.132 kg).  No palpable cervical, supraclavicular, or axillary adenopathy. Large palpable mass in the upper inner quadrant of the right breast. No palpable abnormalities in the left breast.  IMPRESSION: Ms. Bible is a 50 yo female with invasive ductal carcinoma of the right breast. She is a good candidate for external radiation if she chooses to get a lumpectomy.   PLAN: I spoke to the patient today regarding her diagnosis and options for treatment. We  discussed the equivalence in terms of survival and local failure between mastectomy and breast conservation. We discussed the role of radiation in decreasing local failures in patients who undergo lumpectomy even those who have a pathologicl complete response. We discussed the process of simulation and the placement tattoos. We discussed 6 weeks of treatment as an outpatient. We discussed the possibility of asymptomatic lung damage. We discussed the low likelihood of secondary malignancies. We discussed the possible side effects including but not limited to skin redness, fatigue, permanent skin darkening, and breast swelling.  She is leaning towards getting a mastectomy. We discussed if we were to go forward with radiation, we would start a month after surgery. She was referred to a plastic surgeon to discuss reconstruction if she chooses a mastectomy.   I think a mastectomy would be a reasonable option for this patient given the inability for a standard screening mammogram to detect this mass.  I spent 40 minutes  face to face with the patient and more than 50% of that time was spent in counseling and/or coordination of care.   ------------------------------------------------  Thea Silversmith, MD    This document serves as a record of services personally performed by Thea Silversmith, MD. It was created on her behalf by  Lendon Collar, a trained medical scribe. The creation of this record is based on the scribe's personal observations and the provider's statements to them. This document has been checked and approved by the attending provider.

## 2016-02-09 ENCOUNTER — Other Ambulatory Visit: Payer: Self-pay | Admitting: *Deleted

## 2016-02-09 ENCOUNTER — Telehealth: Payer: Self-pay | Admitting: Hematology and Oncology

## 2016-02-09 NOTE — Telephone Encounter (Signed)
Spoke with patient to confirm 7/10 appt at 9 am per 5/25 pof

## 2016-02-09 NOTE — Addendum Note (Signed)
Encounter addended by: Malena Edman, RN on: 02/09/2016  9:15 AM<BR>     Documentation filed: Charges VN

## 2016-02-10 ENCOUNTER — Other Ambulatory Visit: Payer: Managed Care, Other (non HMO)

## 2016-02-10 ENCOUNTER — Other Ambulatory Visit (HOSPITAL_COMMUNITY): Payer: Managed Care, Other (non HMO)

## 2016-02-10 ENCOUNTER — Ambulatory Visit: Payer: Managed Care, Other (non HMO)

## 2016-02-16 ENCOUNTER — Telehealth: Payer: Self-pay | Admitting: Hematology and Oncology

## 2016-02-16 ENCOUNTER — Encounter: Payer: Self-pay | Admitting: Hematology and Oncology

## 2016-02-16 ENCOUNTER — Ambulatory Visit (HOSPITAL_BASED_OUTPATIENT_CLINIC_OR_DEPARTMENT_OTHER): Payer: Managed Care, Other (non HMO)

## 2016-02-16 ENCOUNTER — Ambulatory Visit (HOSPITAL_BASED_OUTPATIENT_CLINIC_OR_DEPARTMENT_OTHER): Payer: Managed Care, Other (non HMO) | Admitting: Hematology and Oncology

## 2016-02-16 ENCOUNTER — Encounter: Payer: Self-pay | Admitting: *Deleted

## 2016-02-16 ENCOUNTER — Other Ambulatory Visit (HOSPITAL_BASED_OUTPATIENT_CLINIC_OR_DEPARTMENT_OTHER): Payer: Managed Care, Other (non HMO)

## 2016-02-16 VITALS — BP 165/85 | HR 85 | Temp 99.0°F | Resp 18 | Wt 150.3 lb

## 2016-02-16 DIAGNOSIS — Z5111 Encounter for antineoplastic chemotherapy: Secondary | ICD-10-CM | POA: Diagnosis not present

## 2016-02-16 DIAGNOSIS — C50211 Malignant neoplasm of upper-inner quadrant of right female breast: Secondary | ICD-10-CM

## 2016-02-16 DIAGNOSIS — Z171 Estrogen receptor negative status [ER-]: Secondary | ICD-10-CM | POA: Diagnosis not present

## 2016-02-16 LAB — COMPREHENSIVE METABOLIC PANEL
ALT: 14 U/L (ref 0–55)
AST: 15 U/L (ref 5–34)
Albumin: 3.9 g/dL (ref 3.5–5.0)
Alkaline Phosphatase: 69 U/L (ref 40–150)
Anion Gap: 6 mEq/L (ref 3–11)
BUN: 10.5 mg/dL (ref 7.0–26.0)
CALCIUM: 9.3 mg/dL (ref 8.4–10.4)
CHLORIDE: 104 meq/L (ref 98–109)
CO2: 29 meq/L (ref 22–29)
CREATININE: 0.9 mg/dL (ref 0.6–1.1)
EGFR: 73 mL/min/{1.73_m2} — ABNORMAL LOW (ref >90)
Glucose: 114 mg/dl (ref 70–140)
POTASSIUM: 4.4 meq/L (ref 3.5–5.1)
SODIUM: 140 meq/L (ref 136–145)
Total Bilirubin: 0.49 mg/dL (ref 0.20–1.20)
Total Protein: 7.3 g/dL (ref 6.4–8.3)

## 2016-02-16 LAB — CBC WITH DIFFERENTIAL/PLATELET
BASO%: 0.7 % (ref 0.0–2.0)
BASOS ABS: 0 10*3/uL (ref 0.0–0.1)
EOS%: 3.9 % (ref 0.0–7.0)
Eosinophils Absolute: 0.3 10*3/uL (ref 0.0–0.5)
HCT: 34.4 % — ABNORMAL LOW (ref 34.8–46.6)
HGB: 11.5 g/dL — ABNORMAL LOW (ref 11.6–15.9)
LYMPH#: 1.8 10*3/uL (ref 0.9–3.3)
LYMPH%: 27.2 % (ref 14.0–49.7)
MCH: 30.5 pg (ref 25.1–34.0)
MCHC: 33.4 g/dL (ref 31.5–36.0)
MCV: 91.2 fL (ref 79.5–101.0)
MONO#: 0.3 10*3/uL (ref 0.1–0.9)
MONO%: 5.3 % (ref 0.0–14.0)
NEUT#: 4.1 10*3/uL (ref 1.5–6.5)
NEUT%: 62.9 % (ref 38.4–76.8)
Platelets: 247 10*3/uL (ref 145–400)
RBC: 3.78 10*6/uL (ref 3.70–5.45)
RDW: 14.5 % (ref 11.2–14.5)
WBC: 6.6 10*3/uL (ref 3.9–10.3)

## 2016-02-16 MED ORDER — PEGFILGRASTIM 6 MG/0.6ML ~~LOC~~ PSKT
6.0000 mg | PREFILLED_SYRINGE | Freq: Once | SUBCUTANEOUS | Status: AC
  Administered 2016-02-16: 6 mg via SUBCUTANEOUS
  Filled 2016-02-16: qty 0.6

## 2016-02-16 MED ORDER — PALONOSETRON HCL INJECTION 0.25 MG/5ML
0.2500 mg | Freq: Once | INTRAVENOUS | Status: AC
  Administered 2016-02-16: 0.25 mg via INTRAVENOUS

## 2016-02-16 MED ORDER — SODIUM CHLORIDE 0.9% FLUSH
10.0000 mL | INTRAVENOUS | Status: DC | PRN
Start: 2016-02-16 — End: 2016-02-16
  Administered 2016-02-16: 10 mL
  Filled 2016-02-16: qty 10

## 2016-02-16 MED ORDER — SODIUM CHLORIDE 0.9 % IV SOLN
Freq: Once | INTRAVENOUS | Status: AC
  Administered 2016-02-16: 13:00:00 via INTRAVENOUS
  Filled 2016-02-16: qty 5

## 2016-02-16 MED ORDER — SODIUM CHLORIDE 0.9 % IV SOLN
600.0000 mg/m2 | Freq: Once | INTRAVENOUS | Status: AC
  Administered 2016-02-16: 1040 mg via INTRAVENOUS
  Filled 2016-02-16: qty 52

## 2016-02-16 MED ORDER — PALONOSETRON HCL INJECTION 0.25 MG/5ML
INTRAVENOUS | Status: AC
  Filled 2016-02-16: qty 5

## 2016-02-16 MED ORDER — DOXORUBICIN HCL CHEMO IV INJECTION 2 MG/ML
60.0000 mg/m2 | Freq: Once | INTRAVENOUS | Status: AC
  Administered 2016-02-16: 104 mg via INTRAVENOUS
  Filled 2016-02-16: qty 52

## 2016-02-16 MED ORDER — SODIUM CHLORIDE 0.9 % IV SOLN
Freq: Once | INTRAVENOUS | Status: AC
  Administered 2016-02-16: 13:00:00 via INTRAVENOUS

## 2016-02-16 MED ORDER — HEPARIN SOD (PORK) LOCK FLUSH 100 UNIT/ML IV SOLN
500.0000 [IU] | Freq: Once | INTRAVENOUS | Status: AC | PRN
  Administered 2016-02-16: 500 [IU]
  Filled 2016-02-16: qty 5

## 2016-02-16 NOTE — Patient Instructions (Signed)
[facility] Discharge Instructions for Patients Receiving Chemotherapy  Today you received the following chemotherapy agents:Adriamycin and Cytoxan   To help prevent nausea and vomiting after your treatment, we encourage you to take your nausea medication as directed.    If you develop nausea and vomiting that is not controlled by your nausea medication, call the clinic.   BELOW ARE SYMPTOMS THAT SHOULD BE REPORTED IMMEDIATELY:  *FEVER GREATER THAN 100.5 F  *CHILLS WITH OR WITHOUT FEVER  NAUSEA AND VOMITING THAT IS NOT CONTROLLED WITH YOUR NAUSEA MEDICATION  *UNUSUAL SHORTNESS OF BREATH  *UNUSUAL BRUISING OR BLEEDING  TENDERNESS IN MOUTH AND THROAT WITH OR WITHOUT PRESENCE OF ULCERS  *URINARY PROBLEMS  *BOWEL PROBLEMS  UNUSUAL RASH Items with * indicate a potential emergency and should be followed up as soon as possible.  Feel free to call the clinic you have any questions or concerns. The clinic phone number is [phone].  Please show the CHEMO ALERT CARD at check-in to the Emergency Department and triage nurse.   

## 2016-02-16 NOTE — Addendum Note (Signed)
Encounter addended by: Malena Edman, RN on: 02/16/2016 11:47 AM<BR>     Documentation filed: Charges VN

## 2016-02-16 NOTE — Telephone Encounter (Signed)
appt made and avs printed °

## 2016-02-16 NOTE — Assessment & Plan Note (Signed)
Right breast biopsy 01/20/2016 12:00: Invasive ductal carcinoma, grade 3, lymphovascular invasion is present, ER 0%, PR 0%, Ki-67 95%, HER-2 negative ratio 1.19; mamm and [place] irregular mass 2.9 x 1.9 x 1.6 cm, no axillary LN T2 N0 stage IIA clinical stage  Treatment plan based on multidisciplinary tumor board: 1. Neoadjuvant chemotherapy with Adriamycin and Cytoxan dose dense 4 followed by Abraxane weekly 12 2. Followed by breast conserving surgery with sentinel lymph node study vs bilateral mastectomies. (patient appears to be very worried that the mammograms did not pick up her breast cancer with mammograms may not be sufficient) 3. Followed by adjuvant radiation therapy 4. Genetic counseling and testing on 03/26/2016 -------------------------------------------------------------------------------------- Current treatment: Cycle 1 day 1 dose dense Adriamycin and Cytoxan  Antiemetics were reviewed Echocardiogram was reviewed (02/04/2016 EF 55-60%) Blood work was reviewed We will be monitoring her very closely for toxicities Return to clinic in 1 week for toxicity check and labs and follow-up

## 2016-02-16 NOTE — Progress Notes (Signed)
Patient Care Team: Vernie Shanks, MD as PCP - General (Family Medicine)  SUMMARY OF ONCOLOGIC HISTORY:   Breast cancer of upper-inner quadrant of right female breast ([place])   01/20/2016 Initial Diagnosis Right breast biopsy 12:00: Invasive ductal carcinoma, grade 3, lymphovascular invasion is present, ER 0%, PR 0%, Ki-67 95%, HER-2 negative ratio 1.19; mamm and [place] irregular mass 2.9 x 1.9 x 1.6 cm, no axillary LN T2 N0 stage IIA clinical stage   02/16/2016 -  Neo-Adjuvant Chemotherapy Dose dense Adriamycin and Cytoxan 4 followed by Abraxane weekly 12    CHIEF COMPLIANT: Cycle 1 dose dense Adriamycin and Cytoxan  INTERVAL HISTORY: Elizabeth Cunningham is a 50 year old with above-mentioned history right breast cancer who is starting neoadjuvant chemotherapy with dose dense Adriamycin and Cytoxan. Today is cycle 1 day 1. She filled all of her medications. She is slightly anxious. She had difficulty with sleeping.  REVIEW OF SYSTEMS:   Constitutional: Denies fevers, chills or abnormal weight loss Eyes: Denies blurriness of vision Ears, nose, mouth, throat, and face: Denies mucositis or sore throat Respiratory: Denies cough, dyspnea or wheezes Cardiovascular: Denies palpitation, chest discomfort Gastrointestinal:  Denies nausea, heartburn or change in bowel habits Skin: Denies abnormal skin rashes Lymphatics: Denies new lymphadenopathy or easy bruising Neurological:Denies numbness, tingling or new weaknesses Behavioral/Psych: Mood is stable, no new changes  Extremities: No lower extremity edema  All other systems were reviewed with the patient and are negative.  I have reviewed the past medical history, past surgical history, social history and family history with the patient and they are unchanged from previous note.  ALLERGIES:  has No Known Allergies.  MEDICATIONS:  Current Outpatient Prescriptions  Medication Sig Dispense Refill  . atenolol (TENORMIN) 25 MG tablet Take 25 mg by  mouth daily.    . Coenzyme Q10 (CO Q 10 PO) Take 1 capsule by mouth daily. Reported on 02/08/2016    . dexamethasone (DECADRON) 4 MG tablet Take 1 tablet (4 mg total) by mouth daily. Take 1 tablet twice daily for 2 days (Patient not taking: Reported on 02/08/2016) 30 tablet 1  . lidocaine-prilocaine (EMLA) cream Apply to affected area once (Patient not taking: Reported on 02/08/2016) 30 g 3  . LORazepam (ATIVAN) 0.5 MG tablet Take 1 tablet (0.5 mg total) by mouth at bedtime. (Patient not taking: Reported on 02/08/2016) 30 tablet 0  . Multiple Vitamins-Minerals (MULTIVITAMIN WITH MINERALS) tablet Take 1 tablet by mouth daily.    . Omega 3 1000 MG CAPS Take 2 capsules by mouth daily.    . ondansetron (ZOFRAN) 8 MG tablet Take 1 tablet (8 mg total) by mouth 2 (two) times daily as needed. Start on the third day after chemotherapy. (Patient not taking: Reported on 02/08/2016) 30 tablet 1  . prochlorperazine (COMPAZINE) 10 MG tablet Take 1 tablet (10 mg total) by mouth every 6 (six) hours as needed (Nausea or vomiting). (Patient not taking: Reported on 02/08/2016) 30 tablet 1   No current facility-administered medications for this visit.    PHYSICAL EXAMINATION: ECOG PERFORMANCE STATUS: 1 - Symptomatic but completely ambulatory  Filed Vitals:   02/16/16 1116  BP: 165/85  Pulse: 85  Temp: 99 F (37.2 C)  Resp: 18   Filed Weights   02/16/16 1116  Weight: 150 lb 4.8 oz (68.176 kg)    GENERAL:alert, no distress and comfortable SKIN: skin color, texture, turgor are normal, no rashes or significant lesions EYES: normal, Conjunctiva are pink and non-injected, sclera clear OROPHARYNX:no exudate, no  erythema and lips, buccal mucosa, and tongue normal  NECK: supple, thyroid normal size, non-tender, without nodularity LYMPH:  no palpable lymphadenopathy in the cervical, axillary or inguinal LUNGS: clear to auscultation and percussion with normal breathing effort HEART: regular rate & rhythm and no  murmurs and no lower extremity edema ABDOMEN:abdomen soft, non-tender and normal bowel sounds MUSCULOSKELETAL:no cyanosis of digits and no clubbing  NEURO: alert & oriented x 3 with fluent speech, no focal motor/sensory deficits EXTREMITIES: No lower extremity edema  LABORATORY DATA:  I have reviewed the data as listed   Chemistry      Component Value Date/Time   NA 140 02/16/2016 1102   NA 138 02/03/2016 1129   K 4.4 02/16/2016 1102   K 5.5* 02/03/2016 1129   CL 102 02/03/2016 1129   CO2 29 02/16/2016 1102   CO2 25 02/03/2016 1129   BUN 10.5 02/16/2016 1102   BUN 12 02/03/2016 1129   CREATININE 0.9 02/16/2016 1102   CREATININE 0.68 02/03/2016 1129      Component Value Date/Time   CALCIUM 9.3 02/16/2016 1102   CALCIUM 9.9 02/03/2016 1129   ALKPHOS 69 02/16/2016 1102   AST 15 02/16/2016 1102   ALT 14 02/16/2016 1102   BILITOT 0.49 02/16/2016 1102       Lab Results  Component Value Date   WBC 6.6 02/16/2016   HGB 11.5* 02/16/2016   HCT 34.4* 02/16/2016   MCV 91.2 02/16/2016   PLT 247 02/16/2016   NEUTROABS 4.1 02/16/2016     ASSESSMENT & PLAN:  Breast cancer of upper-inner quadrant of right female breast ([place]) Right breast biopsy 01/20/2016 12:00: Invasive ductal carcinoma, grade 3, lymphovascular invasion is present, ER 0%, PR 0%, Ki-67 95%, HER-2 negative ratio 1.19; mamm and [place] irregular mass 2.9 x 1.9 x 1.6 cm, no axillary LN T2 N0 stage IIA clinical stage  Treatment plan based on multidisciplinary tumor board: 1. Neoadjuvant chemotherapy with Adriamycin and Cytoxan dose dense 4 followed by Abraxane weekly 12 2. Followed by breast conserving surgery with sentinel lymph node study vs bilateral mastectomies. (patient appears to be very worried that the mammograms did not pick up her breast cancer with mammograms may not be sufficient) 3. Followed by adjuvant radiation therapy 4. Genetic counseling and testing on  03/26/2016 -------------------------------------------------------------------------------------- Current treatment: Cycle 1 day 1 dose dense Adriamycin and Cytoxan  Antiemetics were reviewed Echocardiogram was reviewed (02/04/2016 EF 55-60%) Blood work was reviewed We will be monitoring her very closely for toxicities Return to clinic in 1 week for toxicity check and labs and follow-up  No orders of the defined types were placed in this encounter.   The patient has a good understanding of the overall plan. she agrees with it. she will call with any problems that may develop before the next visit here.   Rulon Eisenmenger, MD 02/16/2016

## 2016-02-23 ENCOUNTER — Encounter: Payer: Self-pay | Admitting: Hematology and Oncology

## 2016-02-23 ENCOUNTER — Ambulatory Visit (HOSPITAL_BASED_OUTPATIENT_CLINIC_OR_DEPARTMENT_OTHER): Payer: Managed Care, Other (non HMO) | Admitting: Hematology and Oncology

## 2016-02-23 ENCOUNTER — Telehealth: Payer: Self-pay | Admitting: Hematology and Oncology

## 2016-02-23 ENCOUNTER — Telehealth: Payer: Self-pay

## 2016-02-23 ENCOUNTER — Other Ambulatory Visit (HOSPITAL_BASED_OUTPATIENT_CLINIC_OR_DEPARTMENT_OTHER): Payer: Managed Care, Other (non HMO)

## 2016-02-23 VITALS — BP 128/74 | HR 76 | Temp 98.5°F | Resp 18 | Ht 65.0 in | Wt 144.5 lb

## 2016-02-23 DIAGNOSIS — C50211 Malignant neoplasm of upper-inner quadrant of right female breast: Secondary | ICD-10-CM

## 2016-02-23 DIAGNOSIS — D6481 Anemia due to antineoplastic chemotherapy: Secondary | ICD-10-CM

## 2016-02-23 DIAGNOSIS — K30 Functional dyspepsia: Secondary | ICD-10-CM | POA: Diagnosis not present

## 2016-02-23 LAB — CBC WITH DIFFERENTIAL/PLATELET
BASO%: 1.2 % (ref 0.0–2.0)
Basophils Absolute: 0 10*3/uL (ref 0.0–0.1)
EOS ABS: 0.2 10*3/uL (ref 0.0–0.5)
EOS%: 11.8 % — ABNORMAL HIGH (ref 0.0–7.0)
HEMATOCRIT: 33.6 % — AB (ref 34.8–46.6)
HEMOGLOBIN: 11.4 g/dL — AB (ref 11.6–15.9)
LYMPH#: 0.9 10*3/uL (ref 0.9–3.3)
LYMPH%: 54.7 % — ABNORMAL HIGH (ref 14.0–49.7)
MCH: 30.5 pg (ref 25.1–34.0)
MCHC: 33.9 g/dL (ref 31.5–36.0)
MCV: 89.8 fL (ref 79.5–101.0)
MONO#: 0.1 10*3/uL (ref 0.1–0.9)
MONO%: 8.2 % (ref 0.0–14.0)
NEUT%: 24.1 % — ABNORMAL LOW (ref 38.4–76.8)
NEUTROS ABS: 0.4 10*3/uL — AB (ref 1.5–6.5)
PLATELETS: 153 10*3/uL (ref 145–400)
RBC: 3.74 10*6/uL (ref 3.70–5.45)
RDW: 13.4 % (ref 11.2–14.5)
WBC: 1.7 10*3/uL — AB (ref 3.9–10.3)

## 2016-02-23 LAB — COMPREHENSIVE METABOLIC PANEL
ALBUMIN: 3.9 g/dL (ref 3.5–5.0)
ALK PHOS: 86 U/L (ref 40–150)
ALT: 9 U/L (ref 0–55)
AST: 10 U/L (ref 5–34)
Anion Gap: 11 mEq/L (ref 3–11)
BILIRUBIN TOTAL: 0.86 mg/dL (ref 0.20–1.20)
BUN: 11.5 mg/dL (ref 7.0–26.0)
CO2: 27 mEq/L (ref 22–29)
Calcium: 9.7 mg/dL (ref 8.4–10.4)
Chloride: 100 mEq/L (ref 98–109)
Creatinine: 0.8 mg/dL (ref 0.6–1.1)
GLUCOSE: 109 mg/dL (ref 70–140)
Potassium: 4.4 mEq/L (ref 3.5–5.1)
SODIUM: 138 meq/L (ref 136–145)
TOTAL PROTEIN: 7.9 g/dL (ref 6.4–8.3)

## 2016-02-23 NOTE — Progress Notes (Signed)
Patient Care Team: Vernie Shanks, MD as PCP - General (Family Medicine)  DIAGNOSIS: No matching staging information was found for the patient.  SUMMARY OF ONCOLOGIC HISTORY:   Breast cancer of upper-inner quadrant of right female breast ([place])   01/20/2016 Initial Diagnosis Right breast biopsy 12:00: Invasive ductal carcinoma, grade 3, lymphovascular invasion is present, ER 0%, PR 0%, Ki-67 95%, HER-2 negative ratio 1.19; mamm and [place] irregular mass 2.9 x 1.9 x 1.6 cm, no axillary LN T2 N0 stage IIA clinical stage   02/16/2016 -  Neo-Adjuvant Chemotherapy Dose dense Adriamycin and Cytoxan 4 followed by Abraxane weekly 12    CHIEF COMPLIANT: Cycle 1 day 8 dose dense Adriamycin and Cytoxan  INTERVAL HISTORY: Elizabeth Cunningham is a 50 year old with above-mentioned history right breast cancer currently on neoadjuvant chemotherapy and today is cycle 1 day 8 of dose dense Adriamycin and Cytoxan. She is here for toxicity evaluation. She had done extremely well from chemotherapy standpoint. She had very mild indigestion but did not have any nausea or vomiting. Denies any fevers or chills. She has pretty good energy levels. She felt fatigued for 1-2 days.  REVIEW OF SYSTEMS:   Constitutional: Denies fevers, chills or abnormal weight loss Eyes: Denies blurriness of vision Ears, nose, mouth, throat, and face: Denies mucositis or sore throat Respiratory: Denies cough, dyspnea or wheezes Cardiovascular: Denies palpitation, chest discomfort Gastrointestinal:  Denies nausea, heartburn or change in bowel habits Skin: Denies abnormal skin rashes Lymphatics: Denies new lymphadenopathy or easy bruising Neurological:Denies numbness, tingling or new weaknesses Behavioral/Psych: Mood is stable, no new changes  Extremities: No lower extremity edema  All other systems were reviewed with the patient and are negative.  I have reviewed the past medical history, past surgical history, social history and family  history with the patient and they are unchanged from previous note.  ALLERGIES:  has No Known Allergies.  MEDICATIONS:  Current Outpatient Prescriptions  Medication Sig Dispense Refill  . atenolol (TENORMIN) 25 MG tablet Take 25 mg by mouth daily.    . Coenzyme Q10 (CO Q 10 PO) Take 1 capsule by mouth daily. Reported on 02/08/2016    . dexamethasone (DECADRON) 4 MG tablet Take 1 tablet (4 mg total) by mouth daily. Take 1 tablet twice daily for 2 days (Patient not taking: Reported on 02/08/2016) 30 tablet 1  . lidocaine-prilocaine (EMLA) cream Apply to affected area once (Patient not taking: Reported on 02/08/2016) 30 g 3  . LORazepam (ATIVAN) 0.5 MG tablet Take 1 tablet (0.5 mg total) by mouth at bedtime. (Patient not taking: Reported on 02/08/2016) 30 tablet 0  . Multiple Vitamins-Minerals (MULTIVITAMIN WITH MINERALS) tablet Take 1 tablet by mouth daily.    . Omega 3 1000 MG CAPS Take 2 capsules by mouth daily.    . ondansetron (ZOFRAN) 8 MG tablet Take 1 tablet (8 mg total) by mouth 2 (two) times daily as needed. Start on the third day after chemotherapy. (Patient not taking: Reported on 02/08/2016) 30 tablet 1  . prochlorperazine (COMPAZINE) 10 MG tablet Take 1 tablet (10 mg total) by mouth every 6 (six) hours as needed (Nausea or vomiting). (Patient not taking: Reported on 02/08/2016) 30 tablet 1   No current facility-administered medications for this visit.    PHYSICAL EXAMINATION: ECOG PERFORMANCE STATUS: 0 - Asymptomatic  Filed Vitals:   02/23/16 0822  BP: 128/74  Pulse: 76  Temp: 98.5 F (36.9 C)  Resp: 18   Filed Weights   02/23/16 9798  Weight: 144 lb 8 oz (65.545 kg)    GENERAL:alert, no distress and comfortable SKIN: skin color, texture, turgor are normal, no rashes or significant lesions EYES: normal, Conjunctiva are pink and non-injected, sclera clear OROPHARYNX:no exudate, no erythema and lips, buccal mucosa, and tongue normal  NECK: supple, thyroid normal size,  non-tender, without nodularity LYMPH:  no palpable lymphadenopathy in the cervical, axillary or inguinal LUNGS: clear to auscultation and percussion with normal breathing effort HEART: regular rate & rhythm and no murmurs and no lower extremity edema ABDOMEN:abdomen soft, non-tender and normal bowel sounds MUSCULOSKELETAL:no cyanosis of digits and no clubbing  NEURO: alert & oriented x 3 with fluent speech, no focal motor/sensory deficits EXTREMITIES: No lower extremity edema  LABORATORY DATA:  I have reviewed the data as listed   Chemistry      Component Value Date/Time   NA 140 02/16/2016 1102   NA 138 02/03/2016 1129   K 4.4 02/16/2016 1102   K 5.5* 02/03/2016 1129   CL 102 02/03/2016 1129   CO2 29 02/16/2016 1102   CO2 25 02/03/2016 1129   BUN 10.5 02/16/2016 1102   BUN 12 02/03/2016 1129   CREATININE 0.9 02/16/2016 1102   CREATININE 0.68 02/03/2016 1129      Component Value Date/Time   CALCIUM 9.3 02/16/2016 1102   CALCIUM 9.9 02/03/2016 1129   ALKPHOS 69 02/16/2016 1102   AST 15 02/16/2016 1102   ALT 14 02/16/2016 1102   BILITOT 0.49 02/16/2016 1102       Lab Results  Component Value Date   WBC 6.6 02/16/2016   HGB 11.5* 02/16/2016   HCT 34.4* 02/16/2016   MCV 91.2 02/16/2016   PLT 247 02/16/2016   NEUTROABS 4.1 02/16/2016     ASSESSMENT & PLAN:  Breast cancer of upper-inner quadrant of right female breast ([place]) Right breast biopsy 01/20/2016 12:00: Invasive ductal carcinoma, grade 3, lymphovascular invasion is present, ER 0%, PR 0%, Ki-67 95%, HER-2 negative ratio 1.19; mamm and [place] irregular mass 2.9 x 1.9 x 1.6 cm, no axillary LN T2 N0 stage IIA clinical stage  Treatment plan based on multidisciplinary tumor board: 1. Neoadjuvant chemotherapy with Adriamycin and Cytoxan dose dense 4 followed by Abraxane weekly 12 2. Followed by breast conserving surgery with sentinel lymph node study vs bilateral mastectomies. (patient appears to be very worried that  the mammograms did not pick up her breast cancer with mammograms may not be sufficient) 3. Followed by adjuvant radiation therapy 4. Genetic counseling and testing on 03/26/2016 -------------------------------------------------------------------------------------- Current treatment: Cycle 1 day 8 dose dense Adriamycin and Cytoxan  Chemotherapy toxicities: 1. Grade 1 anemia  Denies any nausea vomiting, fevers or chills. She spiked to go to [place] this weekend. No evidence of neutropenia. Return to clinic in 1 week for cycle 2   No orders of the defined types were placed in this encounter.   The patient has a good understanding of the overall plan. she agrees with it. she will call with any problems that may develop before the next visit here.   Rulon Eisenmenger, MD 02/23/2016

## 2016-02-23 NOTE — Telephone Encounter (Signed)
Received panic ANC from [facility] in lab at 08:35.  [name], MD notified.  Pt in for NADIR appointment.  No further interventions at this time.

## 2016-02-23 NOTE — Telephone Encounter (Signed)
appt made and avs printed °

## 2016-02-23 NOTE — Assessment & Plan Note (Signed)
Right breast biopsy 01/20/2016 12:00: Invasive ductal carcinoma, grade 3, lymphovascular invasion is present, ER 0%, PR 0%, Ki-67 95%, HER-2 negative ratio 1.19; mamm and [place] irregular mass 2.9 x 1.9 x 1.6 cm, no axillary LN T2 N0 stage IIA clinical stage  Treatment plan based on multidisciplinary tumor board: 1. Neoadjuvant chemotherapy with Adriamycin and Cytoxan dose dense 4 followed by Abraxane weekly 12 2. Followed by breast conserving surgery with sentinel lymph node study vs bilateral mastectomies. (patient appears to be very worried that the mammograms did not pick up her breast cancer with mammograms may not be sufficient) 3. Followed by adjuvant radiation therapy 4. Genetic counseling and testing on 03/26/2016 -------------------------------------------------------------------------------------- Current treatment: Cycle 1 day 8 dose dense Adriamycin and Cytoxan  Chemotherapy toxicities:  Return to clinic in 1 week for cycle 2

## 2016-02-24 ENCOUNTER — Other Ambulatory Visit: Payer: Managed Care, Other (non HMO)

## 2016-02-24 ENCOUNTER — Ambulatory Visit: Payer: Managed Care, Other (non HMO)

## 2016-02-29 ENCOUNTER — Other Ambulatory Visit (HOSPITAL_BASED_OUTPATIENT_CLINIC_OR_DEPARTMENT_OTHER): Payer: Managed Care, Other (non HMO)

## 2016-02-29 ENCOUNTER — Telehealth: Payer: Self-pay | Admitting: Hematology and Oncology

## 2016-02-29 ENCOUNTER — Ambulatory Visit (HOSPITAL_BASED_OUTPATIENT_CLINIC_OR_DEPARTMENT_OTHER): Payer: Managed Care, Other (non HMO) | Admitting: Hematology and Oncology

## 2016-02-29 ENCOUNTER — Ambulatory Visit (HOSPITAL_BASED_OUTPATIENT_CLINIC_OR_DEPARTMENT_OTHER): Payer: Managed Care, Other (non HMO)

## 2016-02-29 ENCOUNTER — Encounter: Payer: Self-pay | Admitting: Hematology and Oncology

## 2016-02-29 VITALS — BP 144/86 | HR 80 | Temp 99.3°F | Resp 18 | Ht 65.0 in | Wt 148.1 lb

## 2016-02-29 DIAGNOSIS — C50211 Malignant neoplasm of upper-inner quadrant of right female breast: Secondary | ICD-10-CM

## 2016-02-29 DIAGNOSIS — D6481 Anemia due to antineoplastic chemotherapy: Secondary | ICD-10-CM

## 2016-02-29 DIAGNOSIS — Z5111 Encounter for antineoplastic chemotherapy: Secondary | ICD-10-CM

## 2016-02-29 DIAGNOSIS — M898X9 Other specified disorders of bone, unspecified site: Secondary | ICD-10-CM

## 2016-02-29 LAB — CBC WITH DIFFERENTIAL/PLATELET
BASO%: 0.6 % (ref 0.0–2.0)
Basophils Absolute: 0.1 10*3/uL (ref 0.0–0.1)
EOS%: 0.6 % (ref 0.0–7.0)
Eosinophils Absolute: 0.1 10*3/uL (ref 0.0–0.5)
HCT: 31.6 % — ABNORMAL LOW (ref 34.8–46.6)
HEMOGLOBIN: 10.8 g/dL — AB (ref 11.6–15.9)
LYMPH%: 17.7 % (ref 14.0–49.7)
MCH: 30.7 pg (ref 25.1–34.0)
MCHC: 34.2 g/dL (ref 31.5–36.0)
MCV: 89.8 fL (ref 79.5–101.0)
MONO#: 1.1 10*3/uL — ABNORMAL HIGH (ref 0.1–0.9)
MONO%: 8.7 % (ref 0.0–14.0)
NEUT%: 72.4 % (ref 38.4–76.8)
NEUTROS ABS: 9.2 10*3/uL — AB (ref 1.5–6.5)
Platelets: 180 10*3/uL (ref 145–400)
RBC: 3.52 10*6/uL — AB (ref 3.70–5.45)
RDW: 13.3 % (ref 11.2–14.5)
WBC: 12.7 10*3/uL — AB (ref 3.9–10.3)
lymph#: 2.2 10*3/uL (ref 0.9–3.3)

## 2016-02-29 LAB — COMPREHENSIVE METABOLIC PANEL
ALBUMIN: 3.8 g/dL (ref 3.5–5.0)
ALK PHOS: 122 U/L (ref 40–150)
ALT: 13 U/L (ref 0–55)
AST: 12 U/L (ref 5–34)
Anion Gap: 9 mEq/L (ref 3–11)
BUN: 11.2 mg/dL (ref 7.0–26.0)
CO2: 28 meq/L (ref 22–29)
Calcium: 9.5 mg/dL (ref 8.4–10.4)
Chloride: 101 mEq/L (ref 98–109)
Creatinine: 0.8 mg/dL (ref 0.6–1.1)
GLUCOSE: 113 mg/dL (ref 70–140)
POTASSIUM: 4.7 meq/L (ref 3.5–5.1)
SODIUM: 138 meq/L (ref 136–145)
TOTAL PROTEIN: 7.6 g/dL (ref 6.4–8.3)
Total Bilirubin: <0.3 mg/dL (ref 0.20–1.20)

## 2016-02-29 MED ORDER — SODIUM CHLORIDE 0.9% FLUSH
10.0000 mL | INTRAVENOUS | Status: DC | PRN
  Administered 2016-02-29: 10 mL
  Filled 2016-02-29: qty 10

## 2016-02-29 MED ORDER — DOXORUBICIN HCL CHEMO IV INJECTION 2 MG/ML
60.0000 mg/m2 | Freq: Once | INTRAVENOUS | Status: AC
  Administered 2016-02-29: 104 mg via INTRAVENOUS
  Filled 2016-02-29: qty 52

## 2016-02-29 MED ORDER — HEPARIN SOD (PORK) LOCK FLUSH 100 UNIT/ML IV SOLN
500.0000 [IU] | Freq: Once | INTRAVENOUS | Status: AC | PRN
  Administered 2016-02-29: 500 [IU]
  Filled 2016-02-29: qty 5

## 2016-02-29 MED ORDER — PALONOSETRON HCL INJECTION 0.25 MG/5ML
INTRAVENOUS | Status: AC
  Filled 2016-02-29: qty 5

## 2016-02-29 MED ORDER — SODIUM CHLORIDE 0.9 % IV SOLN
Freq: Once | INTRAVENOUS | Status: AC
  Administered 2016-02-29: 14:00:00 via INTRAVENOUS

## 2016-02-29 MED ORDER — SODIUM CHLORIDE 0.9 % IV SOLN
Freq: Once | INTRAVENOUS | Status: AC
  Administered 2016-02-29: 15:00:00 via INTRAVENOUS
  Filled 2016-02-29: qty 5

## 2016-02-29 MED ORDER — SODIUM CHLORIDE 0.9 % IV SOLN
600.0000 mg/m2 | Freq: Once | INTRAVENOUS | Status: AC
  Administered 2016-02-29: 1040 mg via INTRAVENOUS
  Filled 2016-02-29: qty 52

## 2016-02-29 MED ORDER — PALONOSETRON HCL INJECTION 0.25 MG/5ML
0.2500 mg | Freq: Once | INTRAVENOUS | Status: AC
  Administered 2016-02-29: 0.25 mg via INTRAVENOUS

## 2016-02-29 MED ORDER — PEGFILGRASTIM 6 MG/0.6ML ~~LOC~~ PSKT
6.0000 mg | PREFILLED_SYRINGE | Freq: Once | SUBCUTANEOUS | Status: AC
  Administered 2016-02-29: 6 mg via SUBCUTANEOUS
  Filled 2016-02-29: qty 0.6

## 2016-02-29 NOTE — Progress Notes (Signed)
Patient Care Team: Vernie Shanks, MD as PCP - General (Family Medicine)  SUMMARY OF ONCOLOGIC HISTORY:   Breast cancer of upper-inner quadrant of right female breast ([place])   01/20/2016 Initial Diagnosis Right breast biopsy 12:00: Invasive ductal carcinoma, grade 3, lymphovascular invasion is present, ER 0%, PR 0%, Ki-67 95%, HER-2 negative ratio 1.19; mamm and [place] irregular mass 2.9 x 1.9 x 1.6 cm, no axillary LN T2 N0 stage IIA clinical stage   02/16/2016 -  Neo-Adjuvant Chemotherapy Dose dense Adriamycin and Cytoxan 4 followed by Abraxane weekly 12    CHIEF COMPLIANT: Cycle 2 dose dense Adriamycin and Cytoxan  INTERVAL HISTORY: Elizabeth Cunningham is a 50 year old with above-mentioned history of right breast cancer currently on neoadjuvant chemotherapy and today's cycle 2 of dose dense Adriamycin and Cytoxan. She tolerated cycle one extremely well. She did not have any nausea vomiting. She reports that she had done quite well except for bone pain related to Neulasta.   REVIEW OF SYSTEMS:   Constitutional: Denies fevers, chills or abnormal weight loss Eyes: Denies blurriness of vision Ears, nose, mouth, throat, and face: Denies mucositis or sore throat Respiratory: Denies cough, dyspnea or wheezes Cardiovascular: Denies palpitation, chest discomfort Gastrointestinal:  Denies nausea, heartburn or change in bowel habits Skin: Denies abnormal skin rashes Lymphatics: Denies new lymphadenopathy or easy bruising Neurological:Denies numbness, tingling or new weaknesses Behavioral/Psych: Mood is stable, no new changes  Extremities: No lower extremity edema Breast:  Breast lump is smaller All other systems were reviewed with the patient and are negative.  I have reviewed the past medical history, past surgical history, social history and family history with the patient and they are unchanged from previous note.  ALLERGIES:  has No Known Allergies.  MEDICATIONS:  Current Outpatient  Prescriptions  Medication Sig Dispense Refill  . atenolol (TENORMIN) 25 MG tablet Take 25 mg by mouth daily.    . Coenzyme Q10 (CO Q 10 PO) Take 1 capsule by mouth daily. Reported on 02/08/2016    . dexamethasone (DECADRON) 4 MG tablet Take 1 tablet (4 mg total) by mouth daily. Take 1 tablet twice daily for 2 days (Patient not taking: Reported on 02/08/2016) 30 tablet 1  . lidocaine-prilocaine (EMLA) cream Apply to affected area once (Patient not taking: Reported on 02/08/2016) 30 g 3  . LORazepam (ATIVAN) 0.5 MG tablet Take 1 tablet (0.5 mg total) by mouth at bedtime. (Patient not taking: Reported on 02/08/2016) 30 tablet 0  . Multiple Vitamins-Minerals (MULTIVITAMIN WITH MINERALS) tablet Take 1 tablet by mouth daily.    . Omega 3 1000 MG CAPS Take 2 capsules by mouth daily.    . ondansetron (ZOFRAN) 8 MG tablet Take 1 tablet (8 mg total) by mouth 2 (two) times daily as needed. Start on the third day after chemotherapy. (Patient not taking: Reported on 02/08/2016) 30 tablet 1  . prochlorperazine (COMPAZINE) 10 MG tablet Take 1 tablet (10 mg total) by mouth every 6 (six) hours as needed (Nausea or vomiting). (Patient not taking: Reported on 02/08/2016) 30 tablet 1   No current facility-administered medications for this visit.    PHYSICAL EXAMINATION: ECOG PERFORMANCE STATUS: 1 - Symptomatic but completely ambulatory  Filed Vitals:   02/29/16 1315  BP: 144/86  Pulse: 80  Temp: 99.3 F (37.4 C)  Resp: 18   Filed Weights   02/29/16 1315  Weight: 148 lb 1.6 oz (67.178 kg)    GENERAL:alert, no distress and comfortable SKIN: skin color, texture, turgor are normal,  no rashes or significant lesions EYES: normal, Conjunctiva are pink and non-injected, sclera clear OROPHARYNX:no exudate, no erythema and lips, buccal mucosa, and tongue normal  NECK: supple, thyroid normal size, non-tender, without nodularity LYMPH:  no palpable lymphadenopathy in the cervical, axillary or inguinal LUNGS: clear to  auscultation and percussion with normal breathing effort HEART: regular rate & rhythm and no murmurs and no lower extremity edema ABDOMEN:abdomen soft, non-tender and normal bowel sounds MUSCULOSKELETAL:no cyanosis of digits and no clubbing  NEURO: alert & oriented x 3 with fluent speech, no focal motor/sensory deficits EXTREMITIES: No lower extremity edema  LABORATORY DATA:  I have reviewed the data as listed   Chemistry      Component Value Date/Time   NA 138 02/23/2016 0804   NA 138 02/03/2016 1129   K 4.4 02/23/2016 0804   K 5.5* 02/03/2016 1129   CL 102 02/03/2016 1129   CO2 27 02/23/2016 0804   CO2 25 02/03/2016 1129   BUN 11.5 02/23/2016 0804   BUN 12 02/03/2016 1129   CREATININE 0.8 02/23/2016 0804   CREATININE 0.68 02/03/2016 1129      Component Value Date/Time   CALCIUM 9.7 02/23/2016 0804   CALCIUM 9.9 02/03/2016 1129   ALKPHOS 86 02/23/2016 0804   AST 10 02/23/2016 0804   ALT 9 02/23/2016 0804   BILITOT 0.86 02/23/2016 0804       Lab Results  Component Value Date   WBC 12.7* 02/29/2016   HGB 10.8* 02/29/2016   HCT 31.6* 02/29/2016   MCV 89.8 02/29/2016   PLT 180 02/29/2016   NEUTROABS 9.2* 02/29/2016     ASSESSMENT & PLAN:  Breast cancer of upper-inner quadrant of right female breast ([place]) Right breast biopsy 01/20/2016 12:00: Invasive ductal carcinoma, grade 3, lymphovascular invasion is present, ER 0%, PR 0%, Ki-67 95%, HER-2 negative ratio 1.19; mamm and [place] irregular mass 2.9 x 1.9 x 1.6 cm, no axillary LN T2 N0 stage IIA clinical stage  Treatment plan based on multidisciplinary tumor board: 1. Neoadjuvant chemotherapy with Adriamycin and Cytoxan dose dense 4 followed by Abraxane weekly 12 2. Followed by breast conserving surgery with sentinel lymph node study vs bilateral mastectomies. (patient appears to be very worried that the mammograms did not pick up her breast cancer with mammograms may not be sufficient) 3. Followed by adjuvant radiation  therapy 4. Genetic counseling and testing on 03/26/2016 -------------------------------------------------------------------------------------- Current treatment: Cycle 2 day 1 dose dense Adriamycin and Cytoxan  Chemotherapy toxicities: 1. Grade 1 anemia: Today is hemoglobin 10.8 2. Bone pain related to Neulasta  Denies any nausea vomiting, fevers or chills.  Return to clinic in 2 weeks for cycle 3   No orders of the defined types were placed in this encounter.   The patient has a good understanding of the overall plan. she agrees with it. she will call with any problems that may develop before the next visit here.   Rulon Eisenmenger, MD 02/29/2016

## 2016-02-29 NOTE — Assessment & Plan Note (Signed)
Breast cancer of upper-inner quadrant of right female breast ([facility]) Right breast biopsy 01/20/2016 12:00: Invasive ductal carcinoma, grade 3, lymphovascular invasion is present, ER 0%, PR 0%, Ki-67 95%, HER-2 negative ratio 1.19; mamm and [place] irregular mass 2.9 x 1.9 x 1.6 cm, no axillary LN T2 N0 stage IIA clinical stage  Treatment plan based on multidisciplinary tumor board: 1. Neoadjuvant chemotherapy with Adriamycin and Cytoxan dose dense 4 followed by Abraxane weekly 12 2. Followed by breast conserving surgery with sentinel lymph node study vs bilateral mastectomies. (patient appears to be very worried that the mammograms did not pick up her breast cancer with mammograms may not be sufficient) 3. Followed by adjuvant radiation therapy 4. Genetic counseling and testing on 03/26/2016 -------------------------------------------------------------------------------------- Current treatment: Cycle 2 day 1 dose dense Adriamycin and Cytoxan  Chemotherapy toxicities: 1. Grade 1 anemia  Denies any nausea vomiting, fevers or chills. She spiked to go to [place] this weekend. No evidence of neutropenia. Return to clinic in 2 weeks for cycle 3 

## 2016-02-29 NOTE — Telephone Encounter (Signed)
appt made and to be printed in treatment room

## 2016-02-29 NOTE — Patient Instructions (Signed)
[facility] Discharge Instructions for Patients Receiving Chemotherapy  Today you received the following chemotherapy agents:Adriamycin and Cytoxan   To help prevent nausea and vomiting after your treatment, we encourage you to take your nausea medication as directed.    If you develop nausea and vomiting that is not controlled by your nausea medication, call the clinic.   BELOW ARE SYMPTOMS THAT SHOULD BE REPORTED IMMEDIATELY:  *FEVER GREATER THAN 100.5 F  *CHILLS WITH OR WITHOUT FEVER  NAUSEA AND VOMITING THAT IS NOT CONTROLLED WITH YOUR NAUSEA MEDICATION  *UNUSUAL SHORTNESS OF BREATH  *UNUSUAL BRUISING OR BLEEDING  TENDERNESS IN MOUTH AND THROAT WITH OR WITHOUT PRESENCE OF ULCERS  *URINARY PROBLEMS  *BOWEL PROBLEMS  UNUSUAL RASH Items with * indicate a potential emergency and should be followed up as soon as possible.  Feel free to call the clinic you have any questions or concerns. The clinic phone number is [phone].  Please show the CHEMO ALERT CARD at check-in to the Emergency Department and triage nurse.   

## 2016-03-02 ENCOUNTER — Encounter: Payer: Self-pay | Admitting: *Deleted

## 2016-03-05 ENCOUNTER — Encounter: Payer: Self-pay | Admitting: *Deleted

## 2016-03-09 ENCOUNTER — Other Ambulatory Visit: Payer: Managed Care, Other (non HMO)

## 2016-03-09 ENCOUNTER — Ambulatory Visit: Payer: Managed Care, Other (non HMO)

## 2016-03-14 ENCOUNTER — Ambulatory Visit: Payer: Managed Care, Other (non HMO)

## 2016-03-14 ENCOUNTER — Other Ambulatory Visit (HOSPITAL_BASED_OUTPATIENT_CLINIC_OR_DEPARTMENT_OTHER): Payer: Managed Care, Other (non HMO)

## 2016-03-14 ENCOUNTER — Ambulatory Visit: Payer: Managed Care, Other (non HMO) | Admitting: Hematology and Oncology

## 2016-03-14 ENCOUNTER — Ambulatory Visit (HOSPITAL_BASED_OUTPATIENT_CLINIC_OR_DEPARTMENT_OTHER): Payer: Managed Care, Other (non HMO)

## 2016-03-14 ENCOUNTER — Encounter: Payer: Self-pay | Admitting: Hematology and Oncology

## 2016-03-14 ENCOUNTER — Ambulatory Visit (HOSPITAL_BASED_OUTPATIENT_CLINIC_OR_DEPARTMENT_OTHER): Payer: Managed Care, Other (non HMO) | Admitting: Hematology and Oncology

## 2016-03-14 VITALS — BP 143/80 | HR 77 | Temp 98.3°F | Resp 18 | Ht 65.0 in | Wt 146.3 lb

## 2016-03-14 DIAGNOSIS — C50211 Malignant neoplasm of upper-inner quadrant of right female breast: Secondary | ICD-10-CM | POA: Diagnosis not present

## 2016-03-14 DIAGNOSIS — Z5111 Encounter for antineoplastic chemotherapy: Secondary | ICD-10-CM | POA: Diagnosis not present

## 2016-03-14 DIAGNOSIS — D6481 Anemia due to antineoplastic chemotherapy: Secondary | ICD-10-CM

## 2016-03-14 DIAGNOSIS — M898X9 Other specified disorders of bone, unspecified site: Secondary | ICD-10-CM | POA: Diagnosis not present

## 2016-03-14 LAB — CBC WITH DIFFERENTIAL/PLATELET
BASO%: 0.4 % (ref 0.0–2.0)
Basophils Absolute: 0 10*3/uL (ref 0.0–0.1)
EOS%: 0 % (ref 0.0–7.0)
Eosinophils Absolute: 0 10*3/uL (ref 0.0–0.5)
HCT: 30.6 % — ABNORMAL LOW (ref 34.8–46.6)
HGB: 10.4 g/dL — ABNORMAL LOW (ref 11.6–15.9)
LYMPH#: 1.7 10*3/uL (ref 0.9–3.3)
LYMPH%: 15 % (ref 14.0–49.7)
MCH: 30.2 pg (ref 25.1–34.0)
MCHC: 34 g/dL (ref 31.5–36.0)
MCV: 89 fL (ref 79.5–101.0)
MONO#: 0.8 10*3/uL (ref 0.1–0.9)
MONO%: 7 % (ref 0.0–14.0)
NEUT%: 77.6 % — ABNORMAL HIGH (ref 38.4–76.8)
NEUTROS ABS: 8.7 10*3/uL — AB (ref 1.5–6.5)
PLATELETS: 145 10*3/uL (ref 145–400)
RBC: 3.44 10*6/uL — AB (ref 3.70–5.45)
RDW: 14.1 % (ref 11.2–14.5)
WBC: 11.3 10*3/uL — AB (ref 3.9–10.3)

## 2016-03-14 LAB — COMPREHENSIVE METABOLIC PANEL
ALT: 14 U/L (ref 0–55)
ANION GAP: 9 meq/L (ref 3–11)
AST: 14 U/L (ref 5–34)
Albumin: 4 g/dL (ref 3.5–5.0)
Alkaline Phosphatase: 128 U/L (ref 40–150)
BUN: 8.2 mg/dL (ref 7.0–26.0)
CHLORIDE: 101 meq/L (ref 98–109)
CO2: 27 meq/L (ref 22–29)
CREATININE: 0.7 mg/dL (ref 0.6–1.1)
Calcium: 9.4 mg/dL (ref 8.4–10.4)
EGFR: >90 mL/min/{1.73_m2} (ref >90)
GLUCOSE: 116 mg/dL (ref 70–140)
Potassium: 4.1 mEq/L (ref 3.5–5.1)
Sodium: 137 mEq/L (ref 136–145)
TOTAL PROTEIN: 7.5 g/dL (ref 6.4–8.3)

## 2016-03-14 MED ORDER — SODIUM CHLORIDE 0.9 % IV SOLN
600.0000 mg/m2 | Freq: Once | INTRAVENOUS | Status: AC
  Administered 2016-03-14: 1040 mg via INTRAVENOUS
  Filled 2016-03-14: qty 52

## 2016-03-14 MED ORDER — SODIUM CHLORIDE 0.9% FLUSH
10.0000 mL | INTRAVENOUS | Status: DC | PRN
  Administered 2016-03-14: 10 mL
  Filled 2016-03-14: qty 10

## 2016-03-14 MED ORDER — PEGFILGRASTIM 6 MG/0.6ML ~~LOC~~ PSKT
6.0000 mg | PREFILLED_SYRINGE | Freq: Once | SUBCUTANEOUS | Status: AC
  Administered 2016-03-14: 6 mg via SUBCUTANEOUS
  Filled 2016-03-14: qty 0.6

## 2016-03-14 MED ORDER — FOSAPREPITANT DIMEGLUMINE INJECTION 150 MG
Freq: Once | INTRAVENOUS | Status: AC
  Administered 2016-03-14: 15:00:00 via INTRAVENOUS
  Filled 2016-03-14: qty 5

## 2016-03-14 MED ORDER — HEPARIN SOD (PORK) LOCK FLUSH 100 UNIT/ML IV SOLN
500.0000 [IU] | Freq: Once | INTRAVENOUS | Status: AC | PRN
  Administered 2016-03-14: 500 [IU]
  Filled 2016-03-14: qty 5

## 2016-03-14 MED ORDER — PALONOSETRON HCL INJECTION 0.25 MG/5ML
INTRAVENOUS | Status: AC
  Filled 2016-03-14: qty 5

## 2016-03-14 MED ORDER — PALONOSETRON HCL INJECTION 0.25 MG/5ML
0.2500 mg | Freq: Once | INTRAVENOUS | Status: AC
  Administered 2016-03-14: 0.25 mg via INTRAVENOUS

## 2016-03-14 MED ORDER — SODIUM CHLORIDE 0.9 % IV SOLN
Freq: Once | INTRAVENOUS | Status: AC
  Administered 2016-03-14: 15:00:00 via INTRAVENOUS

## 2016-03-14 MED ORDER — DOXORUBICIN HCL CHEMO IV INJECTION 2 MG/ML
60.0000 mg/m2 | Freq: Once | INTRAVENOUS | Status: AC
  Administered 2016-03-14: 104 mg via INTRAVENOUS
  Filled 2016-03-14: qty 52

## 2016-03-14 NOTE — Assessment & Plan Note (Signed)
Right breast biopsy 01/20/2016 12:00: Invasive ductal carcinoma, grade 3, lymphovascular invasion is present, ER 0%, PR 0%, Ki-67 95%, HER-2 negative ratio 1.19; mamm and [place] irregular mass 2.9 x 1.9 x 1.6 cm, no axillary LN T2 N0 stage IIA clinical stage  Treatment plan based on multidisciplinary tumor board: 1. Neoadjuvant chemotherapy with Adriamycin and Cytoxan dose dense 4 followed by Abraxane weekly 12 2. Followed by breast conserving surgery with sentinel lymph node study vs bilateral mastectomies. (patient appears to be very worried that the mammograms did not pick up her breast cancer with mammograms may not be sufficient) 3. Followed by adjuvant radiation therapy 4. Genetic counseling and testing on 03/26/2016 -------------------------------------------------------------------------------------- Current treatment: Cycle 3 day 1 dose dense Adriamycin and Cytoxan  Chemotherapy toxicities: 1. Grade 1 anemia: Today is hemoglobin 10.8 2. Bone pain related to Neulasta  Denies any nausea vomiting, fevers or chills.  Return to clinic in 2 weeks for cycle 4

## 2016-03-14 NOTE — Progress Notes (Signed)
Patient Care Team: Vernie Shanks, MD as PCP - General (Family Medicine)  SUMMARY OF ONCOLOGIC HISTORY:   Breast cancer of upper-inner quadrant of right female breast ([place])   01/20/2016 Initial Diagnosis Right breast biopsy 12:00: Invasive ductal carcinoma, grade 3, lymphovascular invasion is present, ER 0%, PR 0%, Ki-67 95%, HER-2 negative ratio 1.19; mamm and [place] irregular mass 2.9 x 1.9 x 1.6 cm, no axillary LN T2 N0 stage IIA clinical stage   02/16/2016 -  Neo-Adjuvant Chemotherapy Dose dense Adriamycin and Cytoxan 4 followed by Abraxane weekly 12    CHIEF COMPLIANT: Cycle 3 dose dense Adriamycin Cytoxan  INTERVAL HISTORY: Elizabeth Cunningham is a 50 year old with above-mentioned breast cancer currently on neoadjuvant chemotherapy today cycle 3 of dose dense tumescent Cytoxan. She appears to tolerating chemotherapy extremely well. She denies any nausea vomiting. She does not have much fatigue. She is working full-time. Denies fevers or chills.   REVIEW OF SYSTEMS:   Constitutional: Denies fevers, chills or abnormal weight loss Eyes: Denies blurriness of vision Ears, nose, mouth, throat, and face: Denies mucositis or sore throat Respiratory: Denies cough, dyspnea or wheezes Cardiovascular: Denies palpitation, chest discomfort Gastrointestinal:  Denies nausea, heartburn or change in bowel habits Skin: Denies abnormal skin rashes Lymphatics: Denies new lymphadenopathy or easy bruising Neurological:Denies numbness, tingling or new weaknesses Behavioral/Psych: Mood is stable, no new changes  Extremities: No lower extremity edema Breast:  denies any pain or lumps or nodules in either breasts All other systems were reviewed with the patient and are negative.  I have reviewed the past medical history, past surgical history, social history and family history with the patient and they are unchanged from previous note.  ALLERGIES:  has No Known Allergies.  MEDICATIONS:  Current Outpatient  Prescriptions  Medication Sig Dispense Refill  . atenolol (TENORMIN) 25 MG tablet Take 25 mg by mouth daily.    . Coenzyme Q10 (CO Q 10 PO) Take 1 capsule by mouth daily. Reported on 02/08/2016    . lidocaine-prilocaine (EMLA) cream Apply to affected area once (Patient not taking: Reported on 02/08/2016) 30 g 3  . LORazepam (ATIVAN) 0.5 MG tablet Take 1 tablet (0.5 mg total) by mouth at bedtime. (Patient not taking: Reported on 02/08/2016) 30 tablet 0  . Multiple Vitamins-Minerals (MULTIVITAMIN WITH MINERALS) tablet Take 1 tablet by mouth daily.    . Omega 3 1000 MG CAPS Take 2 capsules by mouth daily.    . ondansetron (ZOFRAN) 8 MG tablet Take 1 tablet (8 mg total) by mouth 2 (two) times daily as needed. Start on the third day after chemotherapy. (Patient not taking: Reported on 02/08/2016) 30 tablet 1  . prochlorperazine (COMPAZINE) 10 MG tablet Take 1 tablet (10 mg total) by mouth every 6 (six) hours as needed (Nausea or vomiting). (Patient not taking: Reported on 02/08/2016) 30 tablet 1   No current facility-administered medications for this visit.    PHYSICAL EXAMINATION: ECOG PERFORMANCE STATUS: 1 - Symptomatic but completely ambulatory  Filed Vitals:   03/14/16 1406  BP: 143/80  Pulse: 77  Temp: 98.3 F (36.8 C)  Resp: 18   Filed Weights   03/14/16 1406  Weight: 146 lb 4.8 oz (66.361 kg)    GENERAL:alert, no distress and comfortable SKIN: skin color, texture, turgor are normal, no rashes or significant lesions EYES: normal, Conjunctiva are pink and non-injected, sclera clear OROPHARYNX:no exudate, no erythema and lips, buccal mucosa, and tongue normal  NECK: supple, thyroid normal size, non-tender, without nodularity LYMPH:  no palpable lymphadenopathy in the cervical, axillary or inguinal LUNGS: clear to auscultation and percussion with normal breathing effort HEART: regular rate & rhythm and no murmurs and no lower extremity edema ABDOMEN:abdomen soft, non-tender and normal  bowel sounds MUSCULOSKELETAL:no cyanosis of digits and no clubbing  NEURO: alert & oriented x 3 with fluent speech, no focal motor/sensory deficits EXTREMITIES: No lower extremity edema   LABORATORY DATA:  I have reviewed the data as listed   Chemistry      Component Value Date/Time   NA 137 03/14/2016 1330   NA 138 02/03/2016 1129   K 4.1 03/14/2016 1330   K 5.5* 02/03/2016 1129   CL 102 02/03/2016 1129   CO2 27 03/14/2016 1330   CO2 25 02/03/2016 1129   BUN 8.2 03/14/2016 1330   BUN 12 02/03/2016 1129   CREATININE 0.7 03/14/2016 1330   CREATININE 0.68 02/03/2016 1129      Component Value Date/Time   CALCIUM 9.4 03/14/2016 1330   CALCIUM 9.9 02/03/2016 1129   ALKPHOS 128 03/14/2016 1330   AST 14 03/14/2016 1330   ALT 14 03/14/2016 1330   BILITOT <0.30 03/14/2016 1330       Lab Results  Component Value Date   WBC 11.3* 03/14/2016   HGB 10.4* 03/14/2016   HCT 30.6* 03/14/2016   MCV 89.0 03/14/2016   PLT 145 03/14/2016   NEUTROABS 8.7* 03/14/2016     ASSESSMENT & PLAN:  Breast cancer of upper-inner quadrant of right female breast ([place]) Right breast biopsy 01/20/2016 12:00: Invasive ductal carcinoma, grade 3, lymphovascular invasion is present, ER 0%, PR 0%, Ki-67 95%, HER-2 negative ratio 1.19; mamm and [place] irregular mass 2.9 x 1.9 x 1.6 cm, no axillary LN T2 N0 stage IIA clinical stage  Treatment plan based on multidisciplinary tumor board: 1. Neoadjuvant chemotherapy with Adriamycin and Cytoxan dose dense 4 followed by Abraxane weekly 12 2. Followed by breast conserving surgery with sentinel lymph node study vs bilateral mastectomies. (patient appears to be very worried that the mammograms did not pick up her breast cancer with mammograms may not be sufficient) 3. Followed by adjuvant radiation therapy 4. Genetic counseling and testing on 03/26/2016 -------------------------------------------------------------------------------------- Current treatment: Cycle 3  day 1 dose dense Adriamycin and Cytoxan  Chemotherapy toxicities: 1. Grade 1 anemia: Today is hemoglobin 10.4 2. Bone pain related to Neulasta  Denies any nausea vomiting, fevers or chills.  Return to clinic in 2 weeks for cycle 4   No orders of the defined types were placed in this encounter.   The patient has a good understanding of the overall plan. she agrees with it. she will call with any problems that may develop before the next visit here.   Rulon Eisenmenger, MD 03/14/2016

## 2016-03-14 NOTE — Patient Instructions (Signed)
[facility] Discharge Instructions for Patients Receiving Chemotherapy  Today you received the following chemotherapy agents : Adriamycin,  Cytoxan.  To help prevent nausea and vomiting after your treatment, we encourage you to take your nausea medication as prescribed.   If you develop nausea and vomiting that is not controlled by your nausea medication, call the clinic.   BELOW ARE SYMPTOMS THAT SHOULD BE REPORTED IMMEDIATELY:  *FEVER GREATER THAN 100.5 F  *CHILLS WITH OR WITHOUT FEVER  NAUSEA AND VOMITING THAT IS NOT CONTROLLED WITH YOUR NAUSEA MEDICATION  *UNUSUAL SHORTNESS OF BREATH  *UNUSUAL BRUISING OR BLEEDING  TENDERNESS IN MOUTH AND THROAT WITH OR WITHOUT PRESENCE OF ULCERS  *URINARY PROBLEMS  *BOWEL PROBLEMS  UNUSUAL RASH Items with * indicate a potential emergency and should be followed up as soon as possible.  Feel free to call the clinic you have any questions or concerns. The clinic phone number is [phone].  Please show the CHEMO ALERT CARD at check-in to the Emergency Department and triage nurse.   

## 2016-03-23 ENCOUNTER — Other Ambulatory Visit: Payer: Self-pay

## 2016-03-23 ENCOUNTER — Ambulatory Visit: Payer: Managed Care, Other (non HMO)

## 2016-03-23 ENCOUNTER — Other Ambulatory Visit: Payer: Managed Care, Other (non HMO)

## 2016-03-26 ENCOUNTER — Ambulatory Visit (HOSPITAL_BASED_OUTPATIENT_CLINIC_OR_DEPARTMENT_OTHER): Payer: Managed Care, Other (non HMO) | Admitting: Genetic Counselor

## 2016-03-26 ENCOUNTER — Encounter: Payer: Self-pay | Admitting: Genetic Counselor

## 2016-03-26 ENCOUNTER — Other Ambulatory Visit: Payer: Managed Care, Other (non HMO)

## 2016-03-26 DIAGNOSIS — Z315 Encounter for genetic counseling: Secondary | ICD-10-CM

## 2016-03-26 DIAGNOSIS — C50211 Malignant neoplasm of upper-inner quadrant of right female breast: Secondary | ICD-10-CM | POA: Diagnosis not present

## 2016-03-26 NOTE — Progress Notes (Signed)
REFERRING PROVIDER: Vernie Shanks, MD [place], [place] [address]   Elizabeth Lose, MD  PRIMARY PROVIDER:  Anthoney Harada, MD  PRIMARY REASON FOR VISIT:  1. Breast cancer of upper-inner quadrant of right female breast ([place])      HISTORY OF PRESENT ILLNESS:   Elizabeth Cunningham, a 50 y.o. female, was seen for a [place] cancer genetics consultation at the request of Dr. [name] due to a personal history of cancer.  Elizabeth Cunningham presents to clinic today to discuss the possibility of a hereditary predisposition to cancer, genetic testing, and to further clarify her future cancer risks, as well as potential cancer risks for family members.   In 2017, at the age of 14, Elizabeth Cunningham was diagnosed with invasive ductal carcinoma of the right breast. The tumor is triple negative. This is currently being treated with chemotherapy.  She is trying to decide between lumpectomy or mastectomy.    CANCER HISTORY:    Breast cancer of upper-inner quadrant of right female breast ([place])   01/20/2016 Initial Diagnosis Right breast biopsy 12:00: Invasive ductal carcinoma, grade 3, lymphovascular invasion is present, ER 0%, PR 0%, Ki-67 95%, HER-2 negative ratio 1.19; mamm and [place] irregular mass 2.9 x 1.9 x 1.6 cm, no axillary LN T2 N0 stage IIA clinical stage   02/16/2016 -  Neo-Adjuvant Chemotherapy Dose dense Adriamycin and Cytoxan 4 followed by Abraxane weekly 12     HORMONAL RISK FACTORS:  Menarche was at age 66.  First live birth at age 69.  OCP use for approximately 0 years.  Ovaries intact: yes.  Hysterectomy: no.  Menopausal status: perimenopausal.  HRT use: 0 years. Colonoscopy: no; not examined. Mammogram within the last year: yes. Number of breast biopsies: 1. Up to date with pelvic exams:  yes. Any excessive radiation exposure in the past:  no  Past Medical History  Diagnosis Date  . Breast cancer ([place])     triple negative    History reviewed. No pertinent past  surgical history.  Social History   Social History  . Marital Status: Married    Spouse Name: N/A  . Number of Children: 2  . Years of Education: N/A   Social History Main Topics  . Smoking status: Never Smoker   . Smokeless tobacco: None  . Alcohol Use: Yes     Comment: occastional  . Drug Use: None  . Sexual Activity: Not Asked   Other Topics Concern  . None   Social History Narrative     FAMILY HISTORY:  We obtained a detailed, 4-generation family history.  Significant diagnoses are listed below: Family History  Problem Relation Age of Onset  . Adopted: Yes    The patient has two daughters, ages 38 and 58.  She is adopted and does not know any information about her biological family history.    GENETIC COUNSELING ASSESSMENT: Elizabeth Cunningham is a 50 y.o. female with a personal history of triple negative breast cancer which is somewhat suggestive of a hereditary breast and ovarian cancer syndrome and predisposition to cancer. We, therefore, discussed and recommended the following at today's visit.   DISCUSSION: We discussed that about 5-10% of breast cancer is hereditary with most cases due to BRCA mutations.  Based on her triple negative status, we would be most concerned about a BRCA mutation, however other genes associated with triple negative breast cancer include PALB2, and sometimes ATM.  While CHEK2 mutations are not associated specifically with triple negative breast  cancer we do see mutations in this gene as well.  We reviewed the characteristics, features and inheritance patterns of hereditary cancer syndromes. We also discussed genetic testing, including the appropriate family members to test, the process of testing, insurance coverage and turn-around-time for results. We discussed the implications of a negative, positive and/or variant of uncertain significant result. We recommended Elizabeth Cunningham pursue genetic testing for the Breast/Ovarian cancer gene panel.    Based on Elizabeth Cunningham's personal history of cancer, she meets medical criteria for genetic testing. Despite that she meets criteria, she may still have an out of pocket cost. We discussed that if her out of pocket cost for testing is over $100, the laboratory will call and confirm whether she wants to proceed with testing.  If the out of pocket cost of testing is less than $100 she will be billed by the genetic testing laboratory.   PLAN: After considering the risks, benefits, and limitations, Elizabeth Cunningham  provided informed consent to pursue genetic testing and the blood sample was sent to [facility] for analysis of the Breast/Ovarian cancer panel. Results should be available within approximately 2-3 weeks' time, at which point they will be disclosed by telephone to Elizabeth Cunningham, as will any additional recommendations warranted by these results. Elizabeth Cunningham will receive a summary of her genetic counseling visit and a copy of her results once available. This information will also be available in Epic. We encouraged Elizabeth Cunningham to remain in contact with cancer genetics annually so that we can continuously update the family history and inform her of any changes in cancer genetics and testing that may be of benefit for her family. Elizabeth Cunningham questions were answered to her satisfaction today. Our contact information was provided should additional questions or concerns arise.  Lastly, we encouraged Elizabeth Cunningham to remain in contact with cancer genetics annually so that we can continuously update the family history and inform her of any changes in cancer genetics and testing that may be of benefit for this family.   Ms.  Cunningham questions were answered to her satisfaction today. Our contact information was provided should additional questions or concerns arise. Thank you for the referral and allowing [place] to share in the care of your patient.   Karen P. Florene Glen, [place], [facility] Certified Genetic  Counselor [email or web address] phone: [phone]  The patient was seen for a total of 30 minutes in face-to-face genetic counseling.  This patient was discussed with Drs. Magrinat, [name] and/or Burr Medico who agrees with the above.    _______________________________________________________________________ For Office Staff:  Number of people involved in session: 1 Was an Intern/ student involved with case: no

## 2016-03-28 ENCOUNTER — Other Ambulatory Visit (HOSPITAL_BASED_OUTPATIENT_CLINIC_OR_DEPARTMENT_OTHER): Payer: Managed Care, Other (non HMO)

## 2016-03-28 ENCOUNTER — Ambulatory Visit (HOSPITAL_BASED_OUTPATIENT_CLINIC_OR_DEPARTMENT_OTHER): Payer: Managed Care, Other (non HMO)

## 2016-03-28 ENCOUNTER — Ambulatory Visit: Payer: Managed Care, Other (non HMO)

## 2016-03-28 ENCOUNTER — Encounter: Payer: Self-pay | Admitting: Hematology and Oncology

## 2016-03-28 ENCOUNTER — Ambulatory Visit (HOSPITAL_BASED_OUTPATIENT_CLINIC_OR_DEPARTMENT_OTHER): Payer: Managed Care, Other (non HMO) | Admitting: Hematology and Oncology

## 2016-03-28 VITALS — BP 136/76 | HR 80 | Temp 98.8°F | Resp 18 | Ht 65.0 in | Wt 145.4 lb

## 2016-03-28 DIAGNOSIS — L658 Other specified nonscarring hair loss: Secondary | ICD-10-CM

## 2016-03-28 DIAGNOSIS — C50211 Malignant neoplasm of upper-inner quadrant of right female breast: Secondary | ICD-10-CM

## 2016-03-28 DIAGNOSIS — D6481 Anemia due to antineoplastic chemotherapy: Secondary | ICD-10-CM

## 2016-03-28 DIAGNOSIS — M898X9 Other specified disorders of bone, unspecified site: Secondary | ICD-10-CM

## 2016-03-28 DIAGNOSIS — Z5111 Encounter for antineoplastic chemotherapy: Secondary | ICD-10-CM

## 2016-03-28 LAB — CBC WITH DIFFERENTIAL/PLATELET
BASO%: 0.5 % (ref 0.0–2.0)
Basophils Absolute: 0.1 10*3/uL (ref 0.0–0.1)
EOS%: 0.1 % (ref 0.0–7.0)
Eosinophils Absolute: 0 10*3/uL (ref 0.0–0.5)
HCT: 28.1 % — ABNORMAL LOW (ref 34.8–46.6)
HEMOGLOBIN: 9.7 g/dL — AB (ref 11.6–15.9)
LYMPH%: 9.7 % — ABNORMAL LOW (ref 14.0–49.7)
MCH: 31.1 pg (ref 25.1–34.0)
MCHC: 34.5 g/dL (ref 31.5–36.0)
MCV: 90.1 fL (ref 79.5–101.0)
MONO#: 1 10*3/uL — ABNORMAL HIGH (ref 0.1–0.9)
MONO%: 6.9 % (ref 0.0–14.0)
NEUT%: 82.8 % — ABNORMAL HIGH (ref 38.4–76.8)
NEUTROS ABS: 11.5 10*3/uL — AB (ref 1.5–6.5)
NRBC: 1 % — AB (ref 0–0)
Platelets: 180 10*3/uL (ref 145–400)
RBC: 3.12 10*6/uL — AB (ref 3.70–5.45)
RDW: 16 % — AB (ref 11.2–14.5)
WBC: 13.9 10*3/uL — AB (ref 3.9–10.3)
lymph#: 1.3 10*3/uL (ref 0.9–3.3)

## 2016-03-28 LAB — COMPREHENSIVE METABOLIC PANEL
ALBUMIN: 4.2 g/dL (ref 3.5–5.0)
ALK PHOS: 134 U/L (ref 40–150)
ALT: 12 U/L (ref 0–55)
ANION GAP: 9 meq/L (ref 3–11)
AST: 16 U/L (ref 5–34)
BILIRUBIN TOTAL: 0.43 mg/dL (ref 0.20–1.20)
BUN: 9.6 mg/dL (ref 7.0–26.0)
CO2: 28 meq/L (ref 22–29)
Calcium: 9.8 mg/dL (ref 8.4–10.4)
Chloride: 102 mEq/L (ref 98–109)
Creatinine: 0.8 mg/dL (ref 0.6–1.1)
EGFR: 87 mL/min/{1.73_m2} — AB (ref >90)
Glucose: 115 mg/dl (ref 70–140)
POTASSIUM: 4.4 meq/L (ref 3.5–5.1)
Sodium: 139 mEq/L (ref 136–145)
TOTAL PROTEIN: 7.9 g/dL (ref 6.4–8.3)

## 2016-03-28 MED ORDER — SODIUM CHLORIDE 0.9% FLUSH
10.0000 mL | INTRAVENOUS | Status: DC | PRN
  Administered 2016-03-28: 10 mL
  Filled 2016-03-28: qty 10

## 2016-03-28 MED ORDER — DOXORUBICIN HCL CHEMO IV INJECTION 2 MG/ML
60.0000 mg/m2 | Freq: Once | INTRAVENOUS | Status: AC
  Administered 2016-03-28: 104 mg via INTRAVENOUS
  Filled 2016-03-28: qty 52

## 2016-03-28 MED ORDER — HEPARIN SOD (PORK) LOCK FLUSH 100 UNIT/ML IV SOLN
500.0000 [IU] | Freq: Once | INTRAVENOUS | Status: AC | PRN
  Administered 2016-03-28: 500 [IU]
  Filled 2016-03-28: qty 5

## 2016-03-28 MED ORDER — SODIUM CHLORIDE 0.9 % IV SOLN
600.0000 mg/m2 | Freq: Once | INTRAVENOUS | Status: AC
  Administered 2016-03-28: 1040 mg via INTRAVENOUS
  Filled 2016-03-28: qty 52

## 2016-03-28 MED ORDER — PEGFILGRASTIM 6 MG/0.6ML ~~LOC~~ PSKT
6.0000 mg | PREFILLED_SYRINGE | Freq: Once | SUBCUTANEOUS | Status: AC
  Administered 2016-03-28: 6 mg via SUBCUTANEOUS
  Filled 2016-03-28: qty 0.6

## 2016-03-28 MED ORDER — PALONOSETRON HCL INJECTION 0.25 MG/5ML
0.2500 mg | Freq: Once | INTRAVENOUS | Status: AC
  Administered 2016-03-28: 0.25 mg via INTRAVENOUS

## 2016-03-28 MED ORDER — SODIUM CHLORIDE 0.9 % IV SOLN
Freq: Once | INTRAVENOUS | Status: AC
  Administered 2016-03-28: 14:00:00 via INTRAVENOUS
  Filled 2016-03-28: qty 5

## 2016-03-28 MED ORDER — SODIUM CHLORIDE 0.9 % IV SOLN
Freq: Once | INTRAVENOUS | Status: AC
  Administered 2016-03-28: 14:00:00 via INTRAVENOUS

## 2016-03-28 MED ORDER — PALONOSETRON HCL INJECTION 0.25 MG/5ML
INTRAVENOUS | Status: AC
  Filled 2016-03-28: qty 5

## 2016-03-28 NOTE — Assessment & Plan Note (Signed)
Right breast biopsy 01/20/2016 12:00: Invasive ductal carcinoma, grade 3, lymphovascular invasion is present, ER 0%, PR 0%, Ki-67 95%, HER-2 negative ratio 1.19; mamm and [place] irregular mass 2.9 x 1.9 x 1.6 cm, no axillary LN T2 N0 stage IIA clinical stage  Treatment plan based on multidisciplinary tumor board: 1. Neoadjuvant chemotherapy with Adriamycin and Cytoxan dose dense 4 followed by Abraxane weekly 12 2. Followed by breast conserving surgery with sentinel lymph node study vs bilateral mastectomies. (patient appears to be very worried that the mammograms did not pick up her breast cancer with mammograms may not be sufficient) 3. Followed by adjuvant radiation therapy 4. Genetic counseling and testing on 03/26/2016 -------------------------------------------------------------------------------------- Current treatment: Cycle 4 day 1 dose dense Adriamycin and Cytoxan  Chemotherapy toxicities: 1. Grade 1 anemia: Today is hemoglobin 10.4 2. Bone pain related to Neulasta  Denies any nausea vomiting, fevers or chills.  Return to clinic in 2 weeks for cycle 1 Abraxane

## 2016-03-28 NOTE — Patient Instructions (Signed)
[facility] Discharge Instructions for Patients Receiving Chemotherapy  Today you received the following chemotherapy agents : Adriamycin,  Cytoxan.  To help prevent nausea and vomiting after your treatment, we encourage you to take your nausea medication as prescribed.   If you develop nausea and vomiting that is not controlled by your nausea medication, call the clinic.   BELOW ARE SYMPTOMS THAT SHOULD BE REPORTED IMMEDIATELY:  *FEVER GREATER THAN 100.5 F  *CHILLS WITH OR WITHOUT FEVER  NAUSEA AND VOMITING THAT IS NOT CONTROLLED WITH YOUR NAUSEA MEDICATION  *UNUSUAL SHORTNESS OF BREATH  *UNUSUAL BRUISING OR BLEEDING  TENDERNESS IN MOUTH AND THROAT WITH OR WITHOUT PRESENCE OF ULCERS  *URINARY PROBLEMS  *BOWEL PROBLEMS  UNUSUAL RASH Items with * indicate a potential emergency and should be followed up as soon as possible.  Feel free to call the clinic you have any questions or concerns. The clinic phone number is [phone].  Please show the CHEMO ALERT CARD at check-in to the Emergency Department and triage nurse.   

## 2016-03-28 NOTE — Progress Notes (Signed)
Adriamycin administered through a free dripping normal saline line. Positive blood return noted before, every 5 mL during and after adriamycin administration. Patient tolerated well.  

## 2016-03-28 NOTE — Progress Notes (Signed)
Patient Care Team: Vernie Shanks, MD as PCP - General (Family Medicine)  SUMMARY OF ONCOLOGIC HISTORY:   Breast cancer of upper-inner quadrant of right female breast ([place])   01/20/2016 Initial Diagnosis Right breast biopsy 12:00: Invasive ductal carcinoma, grade 3, lymphovascular invasion is present, ER 0%, PR 0%, Ki-67 95%, HER-2 negative ratio 1.19; mamm and [place] irregular mass 2.9 x 1.9 x 1.6 cm, no axillary LN T2 N0 stage IIA clinical stage   02/16/2016 -  Neo-Adjuvant Chemotherapy Dose dense Adriamycin and Cytoxan 4 followed by Abraxane weekly 12    CHIEF COMPLIANT: Cycle 4 of dose dense Adriamycin and Cytoxan  INTERVAL HISTORY: Elizabeth Cunningham is a 50 year old with above-mentioned history of right breast cancer currently on neoadjuvant chemotherapy and today's cycle 4 of dose dense Adriamycin and Cytoxan. She is tolerating chemotherapy extremely well. She has an occasional dry cough. She also complains of fatigue. She denies any fevers or chills. Denies any nausea or vomiting.  REVIEW OF SYSTEMS:   Constitutional: Denies fevers, chills or abnormal weight loss Eyes: Denies blurriness of vision Ears, nose, mouth, throat, and face: Denies mucositis or sore throat Respiratory: Denies cough, dyspnea or wheezes Cardiovascular: Denies palpitation, chest discomfort Gastrointestinal:  Denies nausea, heartburn or change in bowel habits Skin: Denies abnormal skin rashes Lymphatics: Denies new lymphadenopathy or easy bruising Neurological:Denies numbness, tingling or new weaknesses Behavioral/Psych: Mood is stable, no new changes  Extremities: No lower extremity edema Breast:  denies any pain or lumps or nodules in either breasts All other systems were reviewed with the patient and are negative.  I have reviewed the past medical history, past surgical history, social history and family history with the patient and they are unchanged from previous note.  ALLERGIES:  has No Known  Allergies.  MEDICATIONS:  Current Outpatient Prescriptions  Medication Sig Dispense Refill  . atenolol (TENORMIN) 25 MG tablet Take 25 mg by mouth daily.    . Coenzyme Q10 (CO Q 10 PO) Take 1 capsule by mouth daily. Reported on 02/08/2016    . lidocaine-prilocaine (EMLA) cream Apply to affected area once (Patient not taking: Reported on 02/08/2016) 30 g 3  . LORazepam (ATIVAN) 0.5 MG tablet Take 1 tablet (0.5 mg total) by mouth at bedtime. (Patient not taking: Reported on 02/08/2016) 30 tablet 0  . Multiple Vitamins-Minerals (MULTIVITAMIN WITH MINERALS) tablet Take 1 tablet by mouth daily.    . Omega 3 1000 MG CAPS Take 2 capsules by mouth daily.    . ondansetron (ZOFRAN) 8 MG tablet Take 1 tablet (8 mg total) by mouth 2 (two) times daily as needed. Start on the third day after chemotherapy. (Patient not taking: Reported on 02/08/2016) 30 tablet 1  . prochlorperazine (COMPAZINE) 10 MG tablet Take 1 tablet (10 mg total) by mouth every 6 (six) hours as needed (Nausea or vomiting). (Patient not taking: Reported on 02/08/2016) 30 tablet 1   No current facility-administered medications for this visit.    PHYSICAL EXAMINATION: ECOG PERFORMANCE STATUS: 1 - Symptomatic but completely ambulatory  Filed Vitals:   03/28/16 1222  BP: 136/76  Pulse: 80  Temp: 98.8 F (37.1 C)  Resp: 18   Filed Weights   03/28/16 1222  Weight: 145 lb 6.4 oz (65.953 kg)    GENERAL:alert, no distress and comfortable SKIN: skin color, texture, turgor are normal, no rashes or significant lesions EYES: normal, Conjunctiva are pink and non-injected, sclera clear OROPHARYNX:no exudate, no erythema and lips, buccal mucosa, and tongue normal  NECK:  supple, thyroid normal size, non-tender, without nodularity LYMPH:  no palpable lymphadenopathy in the cervical, axillary or inguinal LUNGS: clear to auscultation and percussion with normal breathing effort HEART: regular rate & rhythm and no murmurs and no lower extremity  edema ABDOMEN:abdomen soft, non-tender and normal bowel sounds MUSCULOSKELETAL:no cyanosis of digits and no clubbing  NEURO: alert & oriented x 3 with fluent speech, no focal motor/sensory deficits EXTREMITIES: No lower extremity edema  LABORATORY DATA:  I have reviewed the data as listed   Chemistry      Component Value Date/Time   NA 139 03/28/2016 1210   NA 138 02/03/2016 1129   K 4.4 03/28/2016 1210   K 5.5* 02/03/2016 1129   CL 102 02/03/2016 1129   CO2 28 03/28/2016 1210   CO2 25 02/03/2016 1129   BUN 9.6 03/28/2016 1210   BUN 12 02/03/2016 1129   CREATININE 0.8 03/28/2016 1210   CREATININE 0.68 02/03/2016 1129      Component Value Date/Time   CALCIUM 9.8 03/28/2016 1210   CALCIUM 9.9 02/03/2016 1129   ALKPHOS 134 03/28/2016 1210   AST 16 03/28/2016 1210   ALT 12 03/28/2016 1210   BILITOT 0.43 03/28/2016 1210       Lab Results  Component Value Date   WBC 13.9* 03/28/2016   HGB 9.7* 03/28/2016   HCT 28.1* 03/28/2016   MCV 90.1 03/28/2016   PLT 180 03/28/2016   NEUTROABS 11.5* 03/28/2016     ASSESSMENT & PLAN:  Breast cancer of upper-inner quadrant of right female breast ([place]) Right breast biopsy 01/20/2016 12:00: Invasive ductal carcinoma, grade 3, lymphovascular invasion is present, ER 0%, PR 0%, Ki-67 95%, HER-2 negative ratio 1.19; mamm and [place] irregular mass 2.9 x 1.9 x 1.6 cm, no axillary LN T2 N0 stage IIA clinical stage  Treatment plan based on multidisciplinary tumor board: 1. Neoadjuvant chemotherapy with Adriamycin and Cytoxan dose dense 4 followed by Abraxane weekly 12 2. Followed by breast conserving surgery with sentinel lymph node study vs bilateral mastectomies. (patient appears to be very worried that the mammograms did not pick up her breast cancer with mammograms may not be sufficient) 3. Followed by adjuvant radiation therapy 4. Genetic counseling and testing on  03/26/2016 -------------------------------------------------------------------------------------- Current treatment: Cycle 4 day 1 dose dense Adriamycin and Cytoxan  Chemotherapy toxicities: 1. Grade 2 anemia: Today is hemoglobin 9.7, We are monitoring closely 2. Bone pain related to Neulasta 3. Alopecia  Denies any nausea vomiting, fevers or chills.  Return to clinic in 2 weeks for cycle 1 Abraxane   No orders of the defined types were placed in this encounter.   The patient has a good understanding of the overall plan. she agrees with it. she will call with any problems that may develop before the next visit here.   Rulon Eisenmenger, MD 03/28/2016

## 2016-03-28 NOTE — Progress Notes (Signed)
Patient requested that Cytoxan be ran over 1 hour instead of 30 minutes.  Running faster causes headache and burning behind eyes.

## 2016-04-11 ENCOUNTER — Other Ambulatory Visit: Payer: Self-pay | Admitting: Oncology

## 2016-04-11 ENCOUNTER — Ambulatory Visit (HOSPITAL_BASED_OUTPATIENT_CLINIC_OR_DEPARTMENT_OTHER): Payer: Managed Care, Other (non HMO)

## 2016-04-11 ENCOUNTER — Other Ambulatory Visit (HOSPITAL_BASED_OUTPATIENT_CLINIC_OR_DEPARTMENT_OTHER): Payer: Managed Care, Other (non HMO)

## 2016-04-11 VITALS — BP 116/72 | HR 72 | Temp 97.8°F | Resp 16

## 2016-04-11 DIAGNOSIS — Z5111 Encounter for antineoplastic chemotherapy: Secondary | ICD-10-CM

## 2016-04-11 DIAGNOSIS — C50211 Malignant neoplasm of upper-inner quadrant of right female breast: Secondary | ICD-10-CM | POA: Diagnosis not present

## 2016-04-11 LAB — CBC WITH DIFFERENTIAL/PLATELET
BASO%: 0.4 % (ref 0.0–2.0)
BASOS ABS: 0.1 10*3/uL (ref 0.0–0.1)
EOS ABS: 0 10*3/uL (ref 0.0–0.5)
EOS%: 0.1 % (ref 0.0–7.0)
HEMATOCRIT: 26.9 % — AB (ref 34.8–46.6)
HEMOGLOBIN: 9.2 g/dL — AB (ref 11.6–15.9)
LYMPH#: 1.4 10*3/uL (ref 0.9–3.3)
LYMPH%: 8.8 % — ABNORMAL LOW (ref 14.0–49.7)
MCH: 31.6 pg (ref 25.1–34.0)
MCHC: 34.2 g/dL (ref 31.5–36.0)
MCV: 92.4 fL (ref 79.5–101.0)
MONO#: 0.9 10*3/uL (ref 0.1–0.9)
MONO%: 5.6 % (ref 0.0–14.0)
NEUT#: 13.5 10*3/uL — ABNORMAL HIGH (ref 1.5–6.5)
NEUT%: 85.1 % — AB (ref 38.4–76.8)
PLATELETS: 144 10*3/uL — AB (ref 145–400)
RBC: 2.91 10*6/uL — ABNORMAL LOW (ref 3.70–5.45)
RDW: 17.6 % — AB (ref 11.2–14.5)
WBC: 15.8 10*3/uL — ABNORMAL HIGH (ref 3.9–10.3)

## 2016-04-11 LAB — COMPREHENSIVE METABOLIC PANEL
ALBUMIN: 4 g/dL (ref 3.5–5.0)
ALK PHOS: 162 U/L — AB (ref 40–150)
ALT: 11 U/L (ref 0–55)
ANION GAP: 9 meq/L (ref 3–11)
AST: 14 U/L (ref 5–34)
BUN: 6.2 mg/dL — AB (ref 7.0–26.0)
CALCIUM: 9.6 mg/dL (ref 8.4–10.4)
CO2: 27 mEq/L (ref 22–29)
Chloride: 104 mEq/L (ref 98–109)
Creatinine: 0.8 mg/dL (ref 0.6–1.1)
Glucose: 114 mg/dl (ref 70–140)
POTASSIUM: 4.3 meq/L (ref 3.5–5.1)
Sodium: 140 mEq/L (ref 136–145)
Total Bilirubin: 0.33 mg/dL (ref 0.20–1.20)
Total Protein: 7.6 g/dL (ref 6.4–8.3)

## 2016-04-11 MED ORDER — HEPARIN SOD (PORK) LOCK FLUSH 100 UNIT/ML IV SOLN
500.0000 [IU] | Freq: Once | INTRAVENOUS | Status: AC | PRN
  Administered 2016-04-11: 500 [IU]
  Filled 2016-04-11: qty 5

## 2016-04-11 MED ORDER — PACLITAXEL PROTEIN-BOUND CHEMO INJECTION 100 MG
80.0000 mg/m2 | Freq: Once | INTRAVENOUS | Status: AC
  Administered 2016-04-11: 150 mg via INTRAVENOUS
  Filled 2016-04-11: qty 30

## 2016-04-11 MED ORDER — SODIUM CHLORIDE 0.9% FLUSH
10.0000 mL | INTRAVENOUS | Status: DC | PRN
  Administered 2016-04-11: 10 mL
  Filled 2016-04-11: qty 10

## 2016-04-11 MED ORDER — PROCHLORPERAZINE MALEATE 10 MG PO TABS
10.0000 mg | ORAL_TABLET | Freq: Once | ORAL | Status: AC
  Administered 2016-04-11: 10 mg via ORAL

## 2016-04-11 MED ORDER — SODIUM CHLORIDE 0.9 % IV SOLN
Freq: Once | INTRAVENOUS | Status: AC
  Administered 2016-04-11: 09:00:00 via INTRAVENOUS

## 2016-04-11 MED ORDER — PROCHLORPERAZINE MALEATE 10 MG PO TABS
ORAL_TABLET | ORAL | Status: AC
  Filled 2016-04-11: qty 1

## 2016-04-11 MED ORDER — PACLITAXEL PROTEIN-BOUND CHEMO INJECTION 100 MG: 100.0000 mg/m2 | Freq: Once | INTRAVENOUS | Status: DC

## 2016-04-11 NOTE — Patient Instructions (Addendum)
[facility] Discharge Instructions for Patients Receiving Chemotherapy  Today you received the following chemotherapy agents:  Abraxane.  To help prevent nausea and vomiting after your treatment, we encourage you to take your nausea medication as directed.   If you develop nausea and vomiting that is not controlled by your nausea medication, call the clinic.   BELOW ARE SYMPTOMS THAT SHOULD BE REPORTED IMMEDIATELY:  *FEVER GREATER THAN 100.5 F  *CHILLS WITH OR WITHOUT FEVER  NAUSEA AND VOMITING THAT IS NOT CONTROLLED WITH YOUR NAUSEA MEDICATION  *UNUSUAL SHORTNESS OF BREATH  *UNUSUAL BRUISING OR BLEEDING  TENDERNESS IN MOUTH AND THROAT WITH OR WITHOUT PRESENCE OF ULCERS  *URINARY PROBLEMS  *BOWEL PROBLEMS  UNUSUAL RASH Items with * indicate a potential emergency and should be followed up as soon as possible.  Feel free to call the clinic you have any questions or concerns. The clinic phone number is [phone].  Please show the [facility] at check-in to the Emergency Department and triage nurse. Nanoparticle Albumin-Bound Paclitaxel injection What is this medicine? NANOPARTICLE ALBUMIN-BOUND PACLITAXEL (Na no PAHR ti kuhl al BYOO muhn-bound PAK li TAX el) is a chemotherapy drug. It targets fast dividing cells, like cancer cells, and causes these cells to die. This medicine is used to treat advanced breast cancer and advanced lung cancer. This medicine may be used for other purposes; ask your health care provider or pharmacist if you have questions. What should I tell my health care provider before I take this medicine? They need to know if you have any of these conditions: -kidney disease -liver disease -low blood counts, like low platelets, red blood cells, or white blood cells -recent or ongoing radiation therapy -an unusual or allergic reaction to paclitaxel, albumin, other chemotherapy, other medicines, foods, dyes, or preservatives -pregnant or  trying to get pregnant -breast-feeding How should I use this medicine? This drug is given as an infusion into a vein. It is administered in a hospital or clinic by a specially trained health care professional. Talk to your pediatrician regarding the use of this medicine in children. Special care may be needed. Overdosage: If you think you have taken too much of this medicine contact a poison control center or emergency room at once. NOTE: This medicine is only for you. Do not share this medicine with others. What if I miss a dose? It is important not to miss your dose. Call your doctor or health care professional if you are unable to keep an appointment. What may interact with this medicine? -cyclosporine -diazepam -ketoconazole -medicines to increase blood counts like filgrastim, pegfilgrastim, sargramostim -other chemotherapy drugs like cisplatin, doxorubicin, epirubicin, etoposide, teniposide, vincristine -quinidine -testosterone -vaccines -verapamil Talk to your doctor or health care professional before taking any of these medicines: -acetaminophen -aspirin -ibuprofen -ketoprofen -naproxen This list may not describe all possible interactions. Give your health care provider a list of all the medicines, herbs, non-prescription drugs, or dietary supplements you use. Also tell them if you smoke, drink alcohol, or use illegal drugs. Some items may interact with your medicine. What should I watch for while using this medicine? Your condition will be monitored carefully while you are receiving this medicine. You will need important blood work done while you are taking this medicine. This drug may make you feel generally unwell. This is not uncommon, as chemotherapy can affect healthy cells as well as cancer cells. Report any side effects. Continue your course of treatment even though you feel ill  unless your doctor tells you to stop. In some cases, you may be given additional medicines to  help with side effects. Follow all directions for their use. Call your doctor or health care professional for advice if you get a fever, chills or sore throat, or other symptoms of a cold or flu. Do not treat yourself. This drug decreases your body's ability to fight infections. Try to avoid being around people who are sick. This medicine may increase your risk to bruise or bleed. Call your doctor or health care professional if you notice any unusual bleeding. Be careful brushing and flossing your teeth or using a toothpick because you may get an infection or bleed more easily. If you have any dental work done, tell your dentist you are receiving this medicine. Avoid taking products that contain aspirin, acetaminophen, ibuprofen, naproxen, or ketoprofen unless instructed by your doctor. These medicines may hide a fever. Do not become pregnant while taking this medicine. Women should inform their doctor if they wish to become pregnant or think they might be pregnant. There is a potential for serious side effects to an unborn child. Talk to your health care professional or pharmacist for more information. Do not breast-feed an infant while taking this medicine. Men are advised not to father a child while receiving this medicine. What side effects may I notice from receiving this medicine? Side effects that you should report to your doctor or health care professional as soon as possible: -allergic reactions like skin rash, itching or hives, swelling of the face, lips, or tongue -low blood counts - This drug may decrease the number of white blood cells, red blood cells and platelets. You may be at increased risk for infections and bleeding. -signs of infection - fever or chills, cough, sore throat, pain or difficulty passing urine -signs of decreased platelets or bleeding - bruising, pinpoint red spots on the skin, black, tarry stools, nosebleeds -signs of decreased red blood cells - unusually weak or  tired, fainting spells, lightheadedness -breathing problems -changes in vision -chest pain -high or low blood pressure -mouth sores -nausea and vomiting -pain, swelling, redness or irritation at the injection site -pain, tingling, numbness in the hands or feet -slow or irregular heartbeat -swelling of the ankle, feet, hands Side effects that usually do not require medical attention (report to your doctor or health care professional if they continue or are bothersome): -aches, pains -changes in the color of fingernails -diarrhea -hair loss -loss of appetite This list may not describe all possible side effects. Call your doctor for medical advice about side effects. You may report side effects to FDA at 1-800-FDA-1088. Where should I keep my medicine? This drug is given in a hospital or clinic and will not be stored at home. NOTE: This sheet is a summary. It may not cover all possible information. If you have questions about this medicine, talk to your doctor, pharmacist, or health care provider.    2016, Elsevier/Gold Standard. (2012-10-27 16:48:50)  Nanoparticle Albumin-Bound Paclitaxel injection What is this medicine? NANOPARTICLE ALBUMIN-BOUND PACLITAXEL (Na no PAHR ti kuhl al BYOO muhn-bound PAK li TAX el) is a chemotherapy drug. It targets fast dividing cells, like cancer cells, and causes these cells to die. This medicine is used to treat advanced breast cancer and advanced lung cancer. This medicine may be used for other purposes; ask your health care provider or pharmacist if you have questions. What should I tell my health care provider before I take this medicine? They  need to know if you have any of these conditions: -kidney disease -liver disease -low blood counts, like low platelets, red blood cells, or white blood cells -recent or ongoing radiation therapy -an unusual or allergic reaction to paclitaxel, albumin, other chemotherapy, other medicines, foods, dyes, or  preservatives -pregnant or trying to get pregnant -breast-feeding How should I use this medicine? This drug is given as an infusion into a vein. It is administered in a hospital or clinic by a specially trained health care professional. Talk to your pediatrician regarding the use of this medicine in children. Special care may be needed. Overdosage: If you think you have taken too much of this medicine contact a poison control center or emergency room at once. NOTE: This medicine is only for you. Do not share this medicine with others. What if I miss a dose? It is important not to miss your dose. Call your doctor or health care professional if you are unable to keep an appointment. What may interact with this medicine? -cyclosporine -diazepam -ketoconazole -medicines to increase blood counts like filgrastim, pegfilgrastim, sargramostim -other chemotherapy drugs like cisplatin, doxorubicin, epirubicin, etoposide, teniposide, vincristine -quinidine -testosterone -vaccines -verapamil Talk to your doctor or health care professional before taking any of these medicines: -acetaminophen -aspirin -ibuprofen -ketoprofen -naproxen This list may not describe all possible interactions. Give your health care provider a list of all the medicines, herbs, non-prescription drugs, or dietary supplements you use. Also tell them if you smoke, drink alcohol, or use illegal drugs. Some items may interact with your medicine. What should I watch for while using this medicine? Your condition will be monitored carefully while you are receiving this medicine. You will need important blood work done while you are taking this medicine. This drug may make you feel generally unwell. This is not uncommon, as chemotherapy can affect healthy cells as well as cancer cells. Report any side effects. Continue your course of treatment even though you feel ill unless your doctor tells you to stop. In some cases, you may be  given additional medicines to help with side effects. Follow all directions for their use. Call your doctor or health care professional for advice if you get a fever, chills or sore throat, or other symptoms of a cold or flu. Do not treat yourself. This drug decreases your body's ability to fight infections. Try to avoid being around people who are sick. This medicine may increase your risk to bruise or bleed. Call your doctor or health care professional if you notice any unusual bleeding. Be careful brushing and flossing your teeth or using a toothpick because you may get an infection or bleed more easily. If you have any dental work done, tell your dentist you are receiving this medicine. Avoid taking products that contain aspirin, acetaminophen, ibuprofen, naproxen, or ketoprofen unless instructed by your doctor. These medicines may hide a fever. Do not become pregnant while taking this medicine. Women should inform their doctor if they wish to become pregnant or think they might be pregnant. There is a potential for serious side effects to an unborn child. Talk to your health care professional or pharmacist for more information. Do not breast-feed an infant while taking this medicine. Men are advised not to father a child while receiving this medicine. What side effects may I notice from receiving this medicine? Side effects that you should report to your doctor or health care professional as soon as possible: -allergic reactions like skin rash, itching or hives, swelling of  the face, lips, or tongue -low blood counts - This drug may decrease the number of white blood cells, red blood cells and platelets. You may be at increased risk for infections and bleeding. -signs of infection - fever or chills, cough, sore throat, pain or difficulty passing urine -signs of decreased platelets or bleeding - bruising, pinpoint red spots on the skin, black, tarry stools, nosebleeds -signs of decreased red blood  cells - unusually weak or tired, fainting spells, lightheadedness -breathing problems -changes in vision -chest pain -high or low blood pressure -mouth sores -nausea and vomiting -pain, swelling, redness or irritation at the injection site -pain, tingling, numbness in the hands or feet -slow or irregular heartbeat -swelling of the ankle, feet, hands Side effects that usually do not require medical attention (report to your doctor or health care professional if they continue or are bothersome): -aches, pains -changes in the color of fingernails -diarrhea -hair loss -loss of appetite This list may not describe all possible side effects. Call your doctor for medical advice about side effects. You may report side effects to FDA at 1-800-FDA-1088. Where should I keep my medicine? This drug is given in a hospital or clinic and will not be stored at home. NOTE: This sheet is a summary. It may not cover all possible information. If you have questions about this medicine, talk to your doctor, pharmacist, or health care provider.    2016, Elsevier/Gold Standard. (2012-10-27 16:48:50)

## 2016-04-12 ENCOUNTER — Telehealth: Payer: Self-pay

## 2016-04-12 NOTE — Telephone Encounter (Signed)
-----   Message from Herschell Dimes, RN sent at 04/11/2016 11:09 AM EDT ----- Regarding: [name] - first time Abraxane follow up Contact: [phone] Dr Geralyn Flash pt Elizabeth Cunningham tolerated her first abraxane well today. [phone]

## 2016-04-12 NOTE — Telephone Encounter (Signed)
Called Elizabeth Cunningham for chemotherapy F/U.  Patient is doing well.  Denies n/v.  Denies any new side effects or symptoms.  Bowel and bladder is functioning well.  Eating and drinking well and I instructed to drink 64 oz minimum daily or at least the day before, of and after treatment.  Denies questions at this time and encouraged to call if needed.  Reviewed how to call after hours in the case of an emergency.

## 2016-04-13 ENCOUNTER — Telehealth: Payer: Self-pay | Admitting: Genetic Counselor

## 2016-04-13 NOTE — Telephone Encounter (Signed)
Let patient know that the lab reduced her OOP cost.  Per an email from our lab rep:  "Based on the insurance and financial household information you have provided, the Out-of-Pocket Cost for patient Elizabeth Cunningham DOB 19-Aug-1966 for test Breast/ovarian Cancer Panel (B273M/) is $81.90."

## 2016-04-13 NOTE — Telephone Encounter (Signed)
Spoke with Elizabeth Cunningham.  Patient has OOP of $273.  She should qualify for financial assistance.  I asked her the financial assistance questions and forwarded them to our rep to see if we could get the cost down.

## 2016-04-18 ENCOUNTER — Ambulatory Visit (HOSPITAL_BASED_OUTPATIENT_CLINIC_OR_DEPARTMENT_OTHER): Payer: Managed Care, Other (non HMO) | Admitting: Hematology and Oncology

## 2016-04-18 ENCOUNTER — Other Ambulatory Visit: Payer: Self-pay

## 2016-04-18 ENCOUNTER — Ambulatory Visit: Payer: Managed Care, Other (non HMO)

## 2016-04-18 ENCOUNTER — Ambulatory Visit (HOSPITAL_BASED_OUTPATIENT_CLINIC_OR_DEPARTMENT_OTHER): Payer: Managed Care, Other (non HMO)

## 2016-04-18 ENCOUNTER — Encounter: Payer: Self-pay | Admitting: Hematology and Oncology

## 2016-04-18 ENCOUNTER — Other Ambulatory Visit (HOSPITAL_BASED_OUTPATIENT_CLINIC_OR_DEPARTMENT_OTHER): Payer: Managed Care, Other (non HMO)

## 2016-04-18 ENCOUNTER — Other Ambulatory Visit: Payer: Self-pay | Admitting: Hematology and Oncology

## 2016-04-18 DIAGNOSIS — Z5111 Encounter for antineoplastic chemotherapy: Secondary | ICD-10-CM

## 2016-04-18 DIAGNOSIS — C50211 Malignant neoplasm of upper-inner quadrant of right female breast: Secondary | ICD-10-CM

## 2016-04-18 DIAGNOSIS — L658 Other specified nonscarring hair loss: Secondary | ICD-10-CM | POA: Diagnosis not present

## 2016-04-18 DIAGNOSIS — D6481 Anemia due to antineoplastic chemotherapy: Secondary | ICD-10-CM | POA: Diagnosis not present

## 2016-04-18 LAB — COMPREHENSIVE METABOLIC PANEL
ALT: 24 U/L (ref 0–55)
AST: 19 U/L (ref 5–34)
Albumin: 3.9 g/dL (ref 3.5–5.0)
Alkaline Phosphatase: 79 U/L (ref 40–150)
Anion Gap: 8 mEq/L (ref 3–11)
BUN: 9.4 mg/dL (ref 7.0–26.0)
CALCIUM: 9.3 mg/dL (ref 8.4–10.4)
CHLORIDE: 107 meq/L (ref 98–109)
CO2: 25 meq/L (ref 22–29)
Creatinine: 0.7 mg/dL (ref 0.6–1.1)
EGFR: >90 mL/min/{1.73_m2} (ref >90)
Glucose: 112 mg/dl (ref 70–140)
POTASSIUM: 4.4 meq/L (ref 3.5–5.1)
SODIUM: 141 meq/L (ref 136–145)
Total Bilirubin: 0.5 mg/dL (ref 0.20–1.20)
Total Protein: 7.1 g/dL (ref 6.4–8.3)

## 2016-04-18 LAB — CBC WITH DIFFERENTIAL/PLATELET
BASO%: 1.2 % (ref 0.0–2.0)
Basophils Absolute: 0.1 10*3/uL (ref 0.0–0.1)
EOS ABS: 0 10*3/uL (ref 0.0–0.5)
EOS%: 0.5 % (ref 0.0–7.0)
HEMATOCRIT: 24.8 % — AB (ref 34.8–46.6)
HGB: 8.5 g/dL — ABNORMAL LOW (ref 11.6–15.9)
LYMPH%: 20.3 % (ref 14.0–49.7)
MCH: 32 pg (ref 25.1–34.0)
MCHC: 34.3 g/dL (ref 31.5–36.0)
MCV: 93.2 fL (ref 79.5–101.0)
MONO#: 0.5 10*3/uL (ref 0.1–0.9)
MONO%: 12.2 % (ref 0.0–14.0)
NEUT%: 65.8 % (ref 38.4–76.8)
NEUTROS ABS: 2.8 10*3/uL (ref 1.5–6.5)
NRBC: 2 % — AB (ref 0–0)
PLATELETS: 247 10*3/uL (ref 145–400)
RBC: 2.66 10*6/uL — ABNORMAL LOW (ref 3.70–5.45)
RDW: 19.2 % — ABNORMAL HIGH (ref 11.2–14.5)
WBC: 4.2 10*3/uL (ref 3.9–10.3)
lymph#: 0.9 10*3/uL (ref 0.9–3.3)

## 2016-04-18 MED ORDER — PROCHLORPERAZINE MALEATE 10 MG PO TABS
10.0000 mg | ORAL_TABLET | Freq: Once | ORAL | Status: AC
  Administered 2016-04-18: 10 mg via ORAL

## 2016-04-18 MED ORDER — HEPARIN SOD (PORK) LOCK FLUSH 100 UNIT/ML IV SOLN
500.0000 [IU] | Freq: Once | INTRAVENOUS | Status: AC | PRN
  Administered 2016-04-18: 500 [IU]
  Filled 2016-04-18: qty 5

## 2016-04-18 MED ORDER — PROCHLORPERAZINE MALEATE 10 MG PO TABS
ORAL_TABLET | ORAL | Status: AC
  Filled 2016-04-18: qty 1

## 2016-04-18 MED ORDER — SODIUM CHLORIDE 0.9% FLUSH
10.0000 mL | INTRAVENOUS | Status: DC | PRN
  Administered 2016-04-18: 10 mL
  Filled 2016-04-18: qty 10

## 2016-04-18 MED ORDER — SODIUM CHLORIDE 0.9 % IV SOLN
Freq: Once | INTRAVENOUS | Status: AC
  Administered 2016-04-18: 13:00:00 via INTRAVENOUS

## 2016-04-18 MED ORDER — PACLITAXEL PROTEIN-BOUND CHEMO INJECTION 100 MG
80.0000 mg/m2 | Freq: Once | INTRAVENOUS | Status: AC
  Administered 2016-04-18: 150 mg via INTRAVENOUS
  Filled 2016-04-18: qty 30

## 2016-04-18 NOTE — Patient Instructions (Signed)
[facility] Discharge Instructions for Patients Receiving Chemotherapy  Today you received the following chemotherapy agent: Abraxane  To help prevent nausea and vomiting after your treatment, we encourage you to take your nausea medication as directed. If you develop nausea and vomiting that is not controlled by your nausea medication, call the clinic.   BELOW ARE SYMPTOMS THAT SHOULD BE REPORTED IMMEDIATELY:  *FEVER GREATER THAN 100.5 F  *CHILLS WITH OR WITHOUT FEVER  NAUSEA AND VOMITING THAT IS NOT CONTROLLED WITH YOUR NAUSEA MEDICATION  *UNUSUAL SHORTNESS OF BREATH  *UNUSUAL BRUISING OR BLEEDING  TENDERNESS IN MOUTH AND THROAT WITH OR WITHOUT PRESENCE OF ULCERS  *URINARY PROBLEMS  *BOWEL PROBLEMS  UNUSUAL RASH Items with * indicate a potential emergency and should be followed up as soon as possible.  Feel free to call the clinic you have any questions or concerns. The clinic phone number is [phone].  Please show the [facility] at check-in to the Emergency Department and triage nurse.

## 2016-04-18 NOTE — Assessment & Plan Note (Signed)
Right breast biopsy 01/20/2016 12:00: Invasive ductal carcinoma, grade 3, lymphovascular invasion is present, ER 0%, PR 0%, Ki-67 95%, HER-2 negative ratio 1.19; mamm and [place] irregular mass 2.9 x 1.9 x 1.6 cm, no axillary LN T2 N0 stage IIA clinical stage  Treatment plan based on multidisciplinary tumor board: 1. Neoadjuvant chemotherapy with Adriamycin and Cytoxan dose dense 4 followed by Abraxane weekly 12 2. Followed by breast conserving surgery with sentinel lymph node study vs bilateral mastectomies. (patient appears to be very worried that the mammograms did not pick up her breast cancer with mammograms may not be sufficient) 3. Followed by adjuvant radiation therapy 4. Genetic counseling and testing on 03/26/2016 -------------------------------------------------------------------------------------- Current treatment: Completed 4 cycles of dose dense Adriamycin and Cytoxan, today is cycle 1/12 Abraxane  Chemotherapy toxicities: 1. Grade 2 anemia: Today is hemoglobin 9.7, We are monitoring closely 2. Bone pain related to Neulasta 3. Alopecia  Denies any nausea vomiting, fevers or chills.  Return to clinic in 1 week with cycle 2 Abraxane toxicity check

## 2016-04-18 NOTE — Progress Notes (Signed)
Patient Care Team: Vernie Shanks, MD as PCP - General (Family Medicine)  SUMMARY OF ONCOLOGIC HISTORY:   Breast cancer of upper-inner quadrant of right female breast ([place])   01/20/2016 Initial Diagnosis    Right breast biopsy 12:00: Invasive ductal carcinoma, grade 3, lymphovascular invasion is present, ER 0%, PR 0%, Ki-67 95%, HER-2 negative ratio 1.19; mamm and [place] irregular mass 2.9 x 1.9 x 1.6 cm, no axillary LN T2 N0 stage IIA clinical stage     02/16/2016 -  Neo-Adjuvant Chemotherapy    Dose dense Adriamycin and Cytoxan 4 followed by Abraxane weekly 12      CHIEF COMPLIANT: Cycle 2 Abraxane  INTERVAL HISTORY: Kareen Jefferys is a 50 year old with above-mentioned history of right breast cancer currently on neoadjuvant chemotherapy and today is cycle 2 Abraxane. She tolerated Abraxane cycle 1 extremely well. She did not have any nausea or vomiting. She does not have any problems with fatigue. Her taste is still poor but slowly improving. She denies any fevers or chills. She spent last 5 days with her granddaughter who is 39 years old and appears to have done quite well with her energy levels.   REVIEW OF SYSTEMS:   Constitutional: Denies fevers, chills or abnormal weight loss Eyes: Denies blurriness of vision Ears, nose, mouth, throat, and face: Denies mucositis or sore throat Respiratory: Denies cough, dyspnea or wheezes Cardiovascular: Denies palpitation, chest discomfort Gastrointestinal:  Denies nausea, heartburn or change in bowel habits Skin: Denies abnormal skin rashes Lymphatics: Denies new lymphadenopathy or easy bruising Neurological:Denies numbness, tingling or new weaknesses Behavioral/Psych: Mood is stable, no new changes  Extremities: No lower extremity edema  All other systems were reviewed with the patient and are negative.  I have reviewed the past medical history, past surgical history, social history and family history with the patient and they are  unchanged from previous note.  ALLERGIES:  has No Known Allergies.  MEDICATIONS:  Current Outpatient Prescriptions  Medication Sig Dispense Refill  . atenolol (TENORMIN) 25 MG tablet Take 25 mg by mouth daily.    . Coenzyme Q10 (CO Q 10 PO) Take 1 capsule by mouth daily. Reported on 02/08/2016    . lidocaine-prilocaine (EMLA) cream Apply to affected area once (Patient not taking: Reported on 02/08/2016) 30 g 3  . LORazepam (ATIVAN) 0.5 MG tablet Take 1 tablet (0.5 mg total) by mouth at bedtime. (Patient not taking: Reported on 02/08/2016) 30 tablet 0  . Multiple Vitamins-Minerals (MULTIVITAMIN WITH MINERALS) tablet Take 1 tablet by mouth daily.    . Omega 3 1000 MG CAPS Take 2 capsules by mouth daily.    . ondansetron (ZOFRAN) 8 MG tablet Take 1 tablet (8 mg total) by mouth 2 (two) times daily as needed. Start on the third day after chemotherapy. (Patient not taking: Reported on 02/08/2016) 30 tablet 1  . prochlorperazine (COMPAZINE) 10 MG tablet Take 1 tablet (10 mg total) by mouth every 6 (six) hours as needed (Nausea or vomiting). (Patient not taking: Reported on 02/08/2016) 30 tablet 1   No current facility-administered medications for this visit.     PHYSICAL EXAMINATION: ECOG PERFORMANCE STATUS: 1 - Symptomatic but completely ambulatory  Vitals:   04/18/16 1038  BP: 129/68  Pulse: 79  Resp: 18  Temp: 99 F (37.2 C)   Filed Weights   04/18/16 1038  Weight: 144 lb 12.8 oz (65.7 kg)    GENERAL:alert, no distress and comfortable SKIN: skin color, texture, turgor are normal, no rashes or  significant lesions EYES: normal, Conjunctiva are pink and non-injected, sclera clear OROPHARYNX:no exudate, no erythema and lips, buccal mucosa, and tongue normal  NECK: supple, thyroid normal size, non-tender, without nodularity LYMPH:  no palpable lymphadenopathy in the cervical, axillary or inguinal LUNGS: clear to auscultation and percussion with normal breathing effort HEART: regular rate  & rhythm and no murmurs and no lower extremity edema ABDOMEN:abdomen soft, non-tender and normal bowel sounds MUSCULOSKELETAL:no cyanosis of digits and no clubbing  NEURO: alert & oriented x 3 with fluent speech, no focal motor/sensory deficits EXTREMITIES: No lower extremity edema  LABORATORY DATA:  I have reviewed the data as listed   Chemistry      Component Value Date/Time   NA 140 04/11/2016 0834   K 4.3 04/11/2016 0834   CL 102 02/03/2016 1129   CO2 27 04/11/2016 0834   BUN 6.2 (L) 04/11/2016 0834   CREATININE 0.8 04/11/2016 0834      Component Value Date/Time   CALCIUM 9.6 04/11/2016 0834   ALKPHOS 162 (H) 04/11/2016 0834   AST 14 04/11/2016 0834   ALT 11 04/11/2016 0834   BILITOT 0.33 04/11/2016 0834       Lab Results  Component Value Date   WBC 15.8 (H) 04/11/2016   HGB 9.2 (L) 04/11/2016   HCT 26.9 (L) 04/11/2016   MCV 92.4 04/11/2016   PLT 144 (L) 04/11/2016   NEUTROABS 13.5 (H) 04/11/2016     ASSESSMENT & PLAN:  Breast cancer of upper-inner quadrant of right female breast ([place]) Right breast biopsy 01/20/2016 12:00: Invasive ductal carcinoma, grade 3, lymphovascular invasion is present, ER 0%, PR 0%, Ki-67 95%, HER-2 negative ratio 1.19; mamm and [place] irregular mass 2.9 x 1.9 x 1.6 cm, no axillary LN T2 N0 stage IIA clinical stage  Treatment plan based on multidisciplinary tumor board: 1. Neoadjuvant chemotherapy with Adriamycin and Cytoxan dose dense 4 followed by Abraxane weekly 12 2. Followed by breast conserving surgery with sentinel lymph node study vs bilateral mastectomies. (patient appears to be very worried that the mammograms did not pick up her breast cancer with mammograms may not be sufficient) 3. Followed by adjuvant radiation therapy 4. Genetic counseling and testing on 03/26/2016 -------------------------------------------------------------------------------------- Current treatment: Completed 4 cycles of dose dense Adriamycin and Cytoxan,  today is cycle 2/12 Abraxane  Chemotherapy toxicities: 1. Chemotherapy-induced Grade 2 anemia: hemoglobin 9.2, We are monitoring closely, awaiting today's labs 2. Bone pain related to Neulasta: No longer a problem 3. Alopecia  Denies any nausea vomiting, fevers or chills.  Return to clinic in 2 weeks with cycle 4 Abraxane  No orders of the defined types were placed in this encounter.  The patient has a good understanding of the overall plan. she agrees with it. she will call with any problems that may develop before the next visit here.   Rulon Eisenmenger, MD 04/18/16

## 2016-04-19 ENCOUNTER — Encounter: Payer: Self-pay | Admitting: Genetic Counselor

## 2016-04-19 ENCOUNTER — Telehealth: Payer: Self-pay | Admitting: Genetic Counselor

## 2016-04-19 ENCOUNTER — Ambulatory Visit: Payer: Self-pay | Admitting: Genetic Counselor

## 2016-04-19 DIAGNOSIS — Z1379 Encounter for other screening for genetic and chromosomal anomalies: Secondary | ICD-10-CM

## 2016-04-19 DIAGNOSIS — C50211 Malignant neoplasm of upper-inner quadrant of right female breast: Secondary | ICD-10-CM

## 2016-04-19 NOTE — Progress Notes (Signed)
HPI: [name] was previously seen in the [facility] clinic due to a personal history of cancer and concerns regarding a hereditary predisposition to cancer. Please refer to our prior cancer genetics clinic note for more information regarding [name]'s medical, social and family histories, and our assessment and recommendations, at the time. [name] recent genetic test results were disclosed to her, as were recommendations warranted by these results. These results and recommendations are discussed in more detail below.  FAMILY HISTORY:  We obtained a detailed, 4-generation family history.  Significant diagnoses are listed below: Family History  Problem Relation Age of Onset  . Adopted: Yes    The patient has two daughters, ages 33 and 69.  She is adopted and does not know any information about her biological family history.    GENETIC TEST RESULTS: At the time of [name]'s visit, we recommended she pursue genetic testing of the Breast/Ovarian cancer gene panel. The Breast/Ovarian gene panel offered by [facility] includes sequencing and rearrangement analysis for the following 20 genes:  ATM, BARD1, BRCA1, BRCA2, BRIP1, CDH1, CHEK2, EPCAM, FANCC, MLH1, MSH2, MSH6, NBN, PALB2, PMS2, PTEN, RAD51C, RAD51D, TP53, and XRCC2.   The report date is April 19, 2016.  Genetic testing was normal, and did not reveal a deleterious mutation in these genes. The test report has been scanned into EPIC and is located under the Molecular Pathology section of the Results Review tab.   We discussed with [name] that since the current genetic testing is not perfect, it is possible there may be a gene mutation in one of these genes that current testing cannot detect, but that chance is small. We also discussed, that it is possible that another gene that has not yet been discovered, or that we have not yet tested, is responsible for the cancer diagnoses in the family, and it is,  therefore, important to remain in touch with cancer genetics in the future so that we can continue to offer [name] the most up to date genetic testing.   CANCER SCREENING RECOMMENDATIONS: This result is reassuring and indicates that [name] likely does not have an increased risk for a future cancer due to a mutation in one of these genes. This normal test also suggests that [name]'s cancer was most likely not due to an inherited predisposition associated with one of these genes.  Most cancers happen by chance and this negative test suggests that her cancer falls into this category.  We, therefore, recommended she continue to follow the cancer management and screening guidelines provided by her oncology and primary healthcare provider.   RECOMMENDATIONS FOR FAMILY MEMBERS: Women in this family might be at some increased risk of developing cancer, over the general population risk, simply due to the family history of cancer. We recommended women in this family have a yearly mammogram beginning at age 55, or 22 years younger than the earliest onset of cancer, an an annual clinical breast exam, and perform monthly breast self-exams. Women in this family should also have a gynecological exam as recommended by their primary provider. All family members should have a colonoscopy by age 51.  FOLLOW-UP: Lastly, we discussed with [name] that cancer genetics is a rapidly advancing field and it is possible that new genetic tests will be appropriate for her and/or her family members in the future. We encouraged her to remain in contact with cancer genetics on an annual basis so we can update her personal and  family histories and let her know of advances in cancer genetics that may benefit this family.   Our contact number was provided. [name] questions were answered to her satisfaction, and she knows she is welcome to call us at anytime with additional questions or concerns.   Roma Kayser, MS, [facility] Certified Genetic Counselor Elizabeth Cunningham [email or web address]

## 2016-04-19 NOTE — Telephone Encounter (Signed)
Negative genetic testing on the Breast/ovarian cancer panel.  Discussed that we do not know why she has breast cancer.  There could be a mutation that we are not able to identify or a gene that is not on the panel that could be implicated in the future.  Asked that patient keep in contact with [place] to ensure that there is not additional testing in the future.  We do not have anything that we are concerned about that we would want to test her daughters before.  Patient voiced understanding.

## 2016-04-25 ENCOUNTER — Other Ambulatory Visit (HOSPITAL_BASED_OUTPATIENT_CLINIC_OR_DEPARTMENT_OTHER): Payer: Managed Care, Other (non HMO)

## 2016-04-25 ENCOUNTER — Ambulatory Visit (HOSPITAL_BASED_OUTPATIENT_CLINIC_OR_DEPARTMENT_OTHER): Payer: Managed Care, Other (non HMO)

## 2016-04-25 VITALS — BP 137/82 | HR 73 | Temp 98.4°F | Resp 17

## 2016-04-25 DIAGNOSIS — Z5111 Encounter for antineoplastic chemotherapy: Secondary | ICD-10-CM | POA: Diagnosis not present

## 2016-04-25 DIAGNOSIS — C50211 Malignant neoplasm of upper-inner quadrant of right female breast: Secondary | ICD-10-CM | POA: Diagnosis not present

## 2016-04-25 LAB — COMPREHENSIVE METABOLIC PANEL
ALT: 24 U/L (ref 0–55)
ANION GAP: 9 meq/L (ref 3–11)
AST: 18 U/L (ref 5–34)
Albumin: 4 g/dL (ref 3.5–5.0)
Alkaline Phosphatase: 69 U/L (ref 40–150)
BUN: 11.7 mg/dL (ref 7.0–26.0)
CHLORIDE: 106 meq/L (ref 98–109)
CO2: 25 meq/L (ref 22–29)
Calcium: 9.4 mg/dL (ref 8.4–10.4)
Creatinine: 0.8 mg/dL (ref 0.6–1.1)
EGFR: 87 mL/min/{1.73_m2} — AB (ref >90)
GLUCOSE: 114 mg/dL (ref 70–140)
POTASSIUM: 4.7 meq/L (ref 3.5–5.1)
SODIUM: 141 meq/L (ref 136–145)
Total Bilirubin: 0.52 mg/dL (ref 0.20–1.20)
Total Protein: 7.2 g/dL (ref 6.4–8.3)

## 2016-04-25 LAB — CBC WITH DIFFERENTIAL/PLATELET
BASO%: 1.9 % (ref 0.0–2.0)
BASOS ABS: 0.1 10*3/uL (ref 0.0–0.1)
EOS ABS: 0.1 10*3/uL (ref 0.0–0.5)
EOS%: 2.3 % (ref 0.0–7.0)
HEMATOCRIT: 27.3 % — AB (ref 34.8–46.6)
HEMOGLOBIN: 9.3 g/dL — AB (ref 11.6–15.9)
LYMPH#: 0.9 10*3/uL (ref 0.9–3.3)
LYMPH%: 27.7 % (ref 14.0–49.7)
MCH: 32.5 pg (ref 25.1–34.0)
MCHC: 34.1 g/dL (ref 31.5–36.0)
MCV: 95.5 fL (ref 79.5–101.0)
MONO#: 0.4 10*3/uL (ref 0.1–0.9)
MONO%: 11.6 % (ref 0.0–14.0)
NEUT#: 1.8 10*3/uL (ref 1.5–6.5)
NEUT%: 56.5 % (ref 38.4–76.8)
PLATELETS: 194 10*3/uL (ref 145–400)
RBC: 2.86 10*6/uL — ABNORMAL LOW (ref 3.70–5.45)
RDW: 19.5 % — AB (ref 11.2–14.5)
WBC: 3.1 10*3/uL — ABNORMAL LOW (ref 3.9–10.3)

## 2016-04-25 MED ORDER — PROCHLORPERAZINE MALEATE 10 MG PO TABS
ORAL_TABLET | ORAL | Status: AC
  Filled 2016-04-25: qty 1

## 2016-04-25 MED ORDER — PACLITAXEL PROTEIN-BOUND CHEMO INJECTION 100 MG
80.0000 mg/m2 | Freq: Once | INTRAVENOUS | Status: AC
  Administered 2016-04-25: 150 mg via INTRAVENOUS
  Filled 2016-04-25: qty 30

## 2016-04-25 MED ORDER — SODIUM CHLORIDE 0.9% FLUSH
10.0000 mL | INTRAVENOUS | Status: DC | PRN
  Administered 2016-04-25: 10 mL
  Filled 2016-04-25: qty 10

## 2016-04-25 MED ORDER — SODIUM CHLORIDE 0.9 % IV SOLN
Freq: Once | INTRAVENOUS | Status: AC
  Administered 2016-04-25: 10:00:00 via INTRAVENOUS

## 2016-04-25 MED ORDER — HEPARIN SOD (PORK) LOCK FLUSH 100 UNIT/ML IV SOLN
500.0000 [IU] | Freq: Once | INTRAVENOUS | Status: AC | PRN
  Administered 2016-04-25: 500 [IU]
  Filled 2016-04-25: qty 5

## 2016-04-25 MED ORDER — PROCHLORPERAZINE MALEATE 10 MG PO TABS
10.0000 mg | ORAL_TABLET | Freq: Once | ORAL | Status: AC
  Administered 2016-04-25: 10 mg via ORAL

## 2016-04-25 NOTE — Patient Instructions (Signed)
[facility] Discharge Instructions for Patients Receiving Chemotherapy  Today you received the following chemotherapy agents: Abraxane   To help prevent nausea and vomiting after your treatment, we encourage you to take your nausea medication as directed.    If you develop nausea and vomiting that is not controlled by your nausea medication, call the clinic.   BELOW ARE SYMPTOMS THAT SHOULD BE REPORTED IMMEDIATELY:  *FEVER GREATER THAN 100.5 F  *CHILLS WITH OR WITHOUT FEVER  NAUSEA AND VOMITING THAT IS NOT CONTROLLED WITH YOUR NAUSEA MEDICATION  *UNUSUAL SHORTNESS OF BREATH  *UNUSUAL BRUISING OR BLEEDING  TENDERNESS IN MOUTH AND THROAT WITH OR WITHOUT PRESENCE OF ULCERS  *URINARY PROBLEMS  *BOWEL PROBLEMS  UNUSUAL RASH Items with * indicate a potential emergency and should be followed up as soon as possible.  Feel free to call the clinic you have any questions or concerns. The clinic phone number is [phone].  Please show the CHEMO ALERT CARD at check-in to the Emergency Department and triage nurse.   

## 2016-05-02 ENCOUNTER — Ambulatory Visit (HOSPITAL_BASED_OUTPATIENT_CLINIC_OR_DEPARTMENT_OTHER): Payer: Managed Care, Other (non HMO) | Admitting: Hematology and Oncology

## 2016-05-02 ENCOUNTER — Telehealth: Payer: Self-pay | Admitting: Hematology and Oncology

## 2016-05-02 ENCOUNTER — Other Ambulatory Visit (HOSPITAL_BASED_OUTPATIENT_CLINIC_OR_DEPARTMENT_OTHER): Payer: Managed Care, Other (non HMO)

## 2016-05-02 ENCOUNTER — Ambulatory Visit (HOSPITAL_BASED_OUTPATIENT_CLINIC_OR_DEPARTMENT_OTHER): Payer: Managed Care, Other (non HMO)

## 2016-05-02 ENCOUNTER — Encounter: Payer: Self-pay | Admitting: Hematology and Oncology

## 2016-05-02 VITALS — BP 141/88 | HR 69 | Temp 99.0°F | Resp 18

## 2016-05-02 DIAGNOSIS — Z5111 Encounter for antineoplastic chemotherapy: Secondary | ICD-10-CM | POA: Diagnosis not present

## 2016-05-02 DIAGNOSIS — C50211 Malignant neoplasm of upper-inner quadrant of right female breast: Secondary | ICD-10-CM

## 2016-05-02 DIAGNOSIS — L658 Other specified nonscarring hair loss: Secondary | ICD-10-CM | POA: Diagnosis not present

## 2016-05-02 DIAGNOSIS — D6481 Anemia due to antineoplastic chemotherapy: Secondary | ICD-10-CM | POA: Diagnosis not present

## 2016-05-02 LAB — CBC WITH DIFFERENTIAL/PLATELET
BASO%: 1.9 % (ref 0.0–2.0)
BASOS ABS: 0.1 10*3/uL (ref 0.0–0.1)
EOS ABS: 0.2 10*3/uL (ref 0.0–0.5)
EOS%: 5.1 % (ref 0.0–7.0)
HCT: 28.2 % — ABNORMAL LOW (ref 34.8–46.6)
HEMOGLOBIN: 9.8 g/dL — AB (ref 11.6–15.9)
LYMPH#: 0.9 10*3/uL (ref 0.9–3.3)
LYMPH%: 29.4 % (ref 14.0–49.7)
MCH: 33.4 pg (ref 25.1–34.0)
MCHC: 34.8 g/dL (ref 31.5–36.0)
MCV: 96.2 fL (ref 79.5–101.0)
MONO#: 0.3 10*3/uL (ref 0.1–0.9)
MONO%: 8.6 % (ref 0.0–14.0)
NEUT%: 55 % (ref 38.4–76.8)
NEUTROS ABS: 1.7 10*3/uL (ref 1.5–6.5)
Platelets: 247 10*3/uL (ref 145–400)
RBC: 2.93 10*6/uL — AB (ref 3.70–5.45)
RDW: 18.5 % — AB (ref 11.2–14.5)
WBC: 3.1 10*3/uL — ABNORMAL LOW (ref 3.9–10.3)

## 2016-05-02 LAB — COMPREHENSIVE METABOLIC PANEL
ALT: 21 U/L (ref 0–55)
ANION GAP: 9 meq/L (ref 3–11)
AST: 17 U/L (ref 5–34)
Albumin: 3.8 g/dL (ref 3.5–5.0)
Alkaline Phosphatase: 66 U/L (ref 40–150)
BUN: 7.6 mg/dL (ref 7.0–26.0)
CHLORIDE: 106 meq/L (ref 98–109)
CO2: 25 meq/L (ref 22–29)
CREATININE: 0.7 mg/dL (ref 0.6–1.1)
Calcium: 9.5 mg/dL (ref 8.4–10.4)
Glucose: 110 mg/dl (ref 70–140)
POTASSIUM: 4.1 meq/L (ref 3.5–5.1)
Sodium: 140 mEq/L (ref 136–145)
Total Bilirubin: 0.59 mg/dL (ref 0.20–1.20)
Total Protein: 7.1 g/dL (ref 6.4–8.3)

## 2016-05-02 MED ORDER — PROCHLORPERAZINE MALEATE 10 MG PO TABS
ORAL_TABLET | ORAL | Status: AC
Start: 2016-05-02 — End: 2016-05-02
  Filled 2016-05-02: qty 1

## 2016-05-02 MED ORDER — PACLITAXEL PROTEIN-BOUND CHEMO INJECTION 100 MG
80.0000 mg/m2 | Freq: Once | INTRAVENOUS | Status: AC
  Administered 2016-05-02: 150 mg via INTRAVENOUS
  Filled 2016-05-02: qty 30

## 2016-05-02 MED ORDER — PROCHLORPERAZINE MALEATE 10 MG PO TABS
10.0000 mg | ORAL_TABLET | Freq: Once | ORAL | Status: AC
  Administered 2016-05-02: 10 mg via ORAL

## 2016-05-02 MED ORDER — SODIUM CHLORIDE 0.9% FLUSH
10.0000 mL | INTRAVENOUS | Status: DC | PRN
  Administered 2016-05-02: 10 mL
  Filled 2016-05-02: qty 10

## 2016-05-02 MED ORDER — HEPARIN SOD (PORK) LOCK FLUSH 100 UNIT/ML IV SOLN
500.0000 [IU] | Freq: Once | INTRAVENOUS | Status: AC | PRN
  Administered 2016-05-02: 500 [IU]
  Filled 2016-05-02: qty 5

## 2016-05-02 MED ORDER — SODIUM CHLORIDE 0.9 % IV SOLN
Freq: Once | INTRAVENOUS | Status: AC
  Administered 2016-05-02: 09:00:00 via INTRAVENOUS

## 2016-05-02 NOTE — Progress Notes (Signed)
Patient Care Team: Vernie Shanks, MD as PCP - General (Family Medicine)  SUMMARY OF ONCOLOGIC HISTORY:   Breast cancer of upper-inner quadrant of right female breast ([place])   01/20/2016 Initial Diagnosis    Right breast biopsy 12:00: Invasive ductal carcinoma, grade 3, lymphovascular invasion is present, ER 0%, PR 0%, Ki-67 95%, HER-2 negative ratio 1.19; mamm and [place] irregular mass 2.9 x 1.9 x 1.6 cm, no axillary LN T2 N0 stage IIA clinical stage     02/16/2016 -  Neo-Adjuvant Chemotherapy    Dose dense Adriamycin and Cytoxan 4 followed by Abraxane weekly 12      CHIEF COMPLIANT: Cycle 4 Abraxane  INTERVAL HISTORY: Elizabeth Cunningham is a 50 year old with above-mentioned history of right breast cancer currently on neoadjuvant chemotherapy and today's cycle 4 of Robaxin. She is tolerating Abraxane extremely well. She does not have any nausea vomiting. Denies any neuropathy but denies no skin or nail changes. Denies abdominal pain or constipation or diarrhea. 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 She is able to manage all of that quite well.  REVIEW OF SYSTEMS:   Constitutional: Denies fevers, chills or abnormal weight loss Eyes: Denies blurriness of vision Ears, nose, mouth, throat, and face: Denies mucositis or sore throat Respiratory: Denies cough, dyspnea or wheezes Cardiovascular: Denies palpitation, chest discomfort Gastrointestinal:  Denies nausea, heartburn or change in bowel habits Skin: Denies abnormal skin rashes Lymphatics: Denies new lymphadenopathy or easy bruising Neurological:Denies numbness, tingling or new weaknesses Behavioral/Psych: Mood is stable, no new changes  Extremities: No lower extremity edema  All other systems were reviewed with the patient and are negative.  I have reviewed the past medical history, past surgical history, social history and family history with  the patient and they are unchanged from previous note.  ALLERGIES:  has No Known Allergies.  MEDICATIONS:  Current Outpatient Prescriptions  Medication Sig Dispense Refill  . atenolol (TENORMIN) 25 MG tablet Take 25 mg by mouth daily.    . Coenzyme Q10 (CO Q 10 PO) Take 1 capsule by mouth daily. Reported on 02/08/2016    . lidocaine-prilocaine (EMLA) cream Apply to affected area once (Patient not taking: Reported on 02/08/2016) 30 g 3  . LORazepam (ATIVAN) 0.5 MG tablet Take 1 tablet (0.5 mg total) by mouth at bedtime. (Patient not taking: Reported on 02/08/2016) 30 tablet 0  . Multiple Vitamins-Minerals (MULTIVITAMIN WITH MINERALS) tablet Take 1 tablet by mouth daily.    . Omega 3 1000 MG CAPS Take 2 capsules by mouth daily.    . ondansetron (ZOFRAN) 8 MG tablet Take 1 tablet (8 mg total) by mouth 2 (two) times daily as needed. Start on the third day after chemotherapy. (Patient not taking: Reported on 02/08/2016) 30 tablet 1  . prochlorperazine (COMPAZINE) 10 MG tablet Take 1 tablet (10 mg total) by mouth every 6 (six) hours as needed (Nausea or vomiting). (Patient not taking: Reported on 02/08/2016) 30 tablet 1   No current facility-administered medications for this visit.     PHYSICAL EXAMINATION: ECOG PERFORMANCE STATUS: 1 - Symptomatic but completely ambulatory  There were no vitals filed for this visit. There were no vitals filed for this visit.  GENERAL:alert, no distress and comfortable SKIN: skin color, texture, turgor are normal, no rashes or significant lesions EYES: normal, Conjunctiva are pink and non-injected, sclera clear OROPHARYNX:no exudate, no erythema and lips, buccal mucosa, and tongue normal  NECK: supple, thyroid normal size, non-tender, without nodularity LYMPH:  no palpable lymphadenopathy in the cervical, axillary or inguinal LUNGS: clear to auscultation and percussion with normal breathing effort HEART: regular rate & rhythm and no murmurs and no lower extremity  edema ABDOMEN:abdomen soft, non-tender and normal bowel sounds MUSCULOSKELETAL:no cyanosis of digits and no clubbing  NEURO: alert & oriented x 3 with fluent speech, no focal motor/sensory deficits EXTREMITIES: No lower extremity edema LABORATORY DATA:  I have reviewed the data as listed   Chemistry      Component Value Date/Time   NA 141 04/25/2016 0851   K 4.7 04/25/2016 0851   CL 102 02/03/2016 1129   CO2 25 04/25/2016 0851   BUN 11.7 04/25/2016 0851   CREATININE 0.8 04/25/2016 0851      Component Value Date/Time   CALCIUM 9.4 04/25/2016 0851   ALKPHOS 69 04/25/2016 0851   AST 18 04/25/2016 0851   ALT 24 04/25/2016 0851   BILITOT 0.52 04/25/2016 0851       Lab Results  Component Value Date   WBC 3.1 (L) 05/02/2016   HGB 9.8 (L) 05/02/2016   HCT 28.2 (L) 05/02/2016   MCV 96.2 05/02/2016   PLT 247 05/02/2016   NEUTROABS 1.7 05/02/2016     ASSESSMENT & PLAN:  Breast cancer of upper-inner quadrant of right female breast ([place]) Right breast biopsy 01/20/2016 12:00: Invasive ductal carcinoma, grade 3, lymphovascular invasion is present, ER 0%, PR 0%, Ki-67 95%, HER-2 negative ratio 1.19; mamm and [place] irregular mass 2.9 x 1.9 x 1.6 cm, no axillary LN T2 N0 stage IIA clinical stage  Treatment planbased on multidisciplinary tumor board: 1. Neoadjuvant chemotherapy with Adriamycin and Cytoxan dose dense 4 followed by Abraxane weekly 12 2. Followed by breast conserving surgery with sentinel lymph node study vs bilateral mastectomies. (patient appears to be very worried that the mammograms did not pick up her breast cancer with mammograms may not be sufficient) 3. Followed by adjuvant radiation therapy 4. Genetic counseling and testing on 03/26/2016 -------------------------------------------------------------------------------------- Current treatment: Completed 4 cycles of dose dense Adriamycin and Cytoxan, today is cycle 4/12 Abraxane  Chemotherapy toxicities: 1.  Chemotherapy-induced Grade 2 anemia: hemoglobin 9.2, We are monitoring closely, awaiting today's labs 2. Bone pain related to Neulasta: No longer a problem 3. Alopecia  Denies any nausea vomiting, fevers or chills.  Return to clinic in 2 weeks with cycle  6 Abraxane   No orders of the defined types were placed in this encounter.  The patient has a good understanding of the overall plan. she agrees with it. she will call with any problems that may develop before the next visit here.   Rulon Eisenmenger, MD 05/02/16

## 2016-05-02 NOTE — Assessment & Plan Note (Signed)
Right breast biopsy 01/20/2016 12:00: Invasive ductal carcinoma, grade 3, lymphovascular invasion is present, ER 0%, PR 0%, Ki-67 95%, HER-2 negative ratio 1.19; mamm and [place] irregular mass 2.9 x 1.9 x 1.6 cm, no axillary LN T2 N0 stage IIA clinical stage  Treatment planbased on multidisciplinary tumor board: 1. Neoadjuvant chemotherapy with Adriamycin and Cytoxan dose dense 4 followed by Abraxane weekly 12 2. Followed by breast conserving surgery with sentinel lymph node study vs bilateral mastectomies. (patient appears to be very worried that the mammograms did not pick up her breast cancer with mammograms may not be sufficient) 3. Followed by adjuvant radiation therapy 4. Genetic counseling and testing on 03/26/2016 -------------------------------------------------------------------------------------- Current treatment: Completed 4 cycles of dose dense Adriamycin and Cytoxan, today is cycle 4/12 Abraxane  Chemotherapy toxicities: 1. Chemotherapy-induced Grade 2 anemia: hemoglobin 9.2, We are monitoring closely, awaiting today's labs 2. Bone pain related to Neulasta: No longer a problem 3. Alopecia  Denies any nausea vomiting, fevers or chills.  Return to clinic in 2 weeks with cycle 6 Abraxane

## 2016-05-02 NOTE — Patient Instructions (Signed)
[facility] Discharge Instructions for Patients Receiving Chemotherapy  Today you received the following chemotherapy agents:  Abraxane  To help prevent nausea and vomiting after your treatment, we encourage you to take your nausea medication as ordered per MD.   If you develop nausea and vomiting that is not controlled by your nausea medication, call the clinic.   BELOW ARE SYMPTOMS THAT SHOULD BE REPORTED IMMEDIATELY:  *FEVER GREATER THAN 100.5 F  *CHILLS WITH OR WITHOUT FEVER  NAUSEA AND VOMITING THAT IS NOT CONTROLLED WITH YOUR NAUSEA MEDICATION  *UNUSUAL SHORTNESS OF BREATH  *UNUSUAL BRUISING OR BLEEDING  TENDERNESS IN MOUTH AND THROAT WITH OR WITHOUT PRESENCE OF ULCERS  *URINARY PROBLEMS  *BOWEL PROBLEMS  UNUSUAL RASH Items with * indicate a potential emergency and should be followed up as soon as possible.  Feel free to call the clinic you have any questions or concerns. The clinic phone number is [phone].  Please show the CHEMO ALERT CARD at check-in to the Emergency Department and triage nurse.   

## 2016-05-02 NOTE — Telephone Encounter (Signed)
appt made and avs to print in treatment room °

## 2016-05-09 ENCOUNTER — Other Ambulatory Visit (HOSPITAL_BASED_OUTPATIENT_CLINIC_OR_DEPARTMENT_OTHER): Payer: Managed Care, Other (non HMO)

## 2016-05-09 ENCOUNTER — Ambulatory Visit (HOSPITAL_COMMUNITY)
Admission: RE | Admit: 2016-05-09 | Discharge: 2016-05-09 | Disposition: A | Discharge status: Home / Self Care | Payer: Managed Care, Other (non HMO) | Source: Ambulatory Visit | Attending: Family Medicine | Admitting: Family Medicine

## 2016-05-09 ENCOUNTER — Ambulatory Visit (HOSPITAL_BASED_OUTPATIENT_CLINIC_OR_DEPARTMENT_OTHER)
Admission: RE | Admit: 2016-05-09 | Discharge: 2016-05-09 | Disposition: A | Discharge status: Home / Self Care | Payer: Managed Care, Other (non HMO) | Source: Ambulatory Visit | Attending: Internal Medicine | Admitting: Internal Medicine

## 2016-05-09 ENCOUNTER — Ambulatory Visit (HOSPITAL_BASED_OUTPATIENT_CLINIC_OR_DEPARTMENT_OTHER): Payer: Managed Care, Other (non HMO)

## 2016-05-09 ENCOUNTER — Encounter (HOSPITAL_COMMUNITY): Payer: Self-pay | Admitting: Internal Medicine

## 2016-05-09 VITALS — BP 155/87 | HR 72 | Temp 98.8°F | Resp 18

## 2016-05-09 VITALS — BP 130/74 | HR 79 | Wt 145.8 lb

## 2016-05-09 DIAGNOSIS — C50211 Malignant neoplasm of upper-inner quadrant of right female breast: Secondary | ICD-10-CM

## 2016-05-09 DIAGNOSIS — Z5111 Encounter for antineoplastic chemotherapy: Secondary | ICD-10-CM

## 2016-05-09 LAB — CBC WITH DIFFERENTIAL/PLATELET
BASO%: 2.8 % — ABNORMAL HIGH (ref 0.0–2.0)
BASOS ABS: 0.1 10*3/uL (ref 0.0–0.1)
EOS%: 4.3 % (ref 0.0–7.0)
Eosinophils Absolute: 0.2 10*3/uL (ref 0.0–0.5)
HCT: 29.3 % — ABNORMAL LOW (ref 34.8–46.6)
HEMOGLOBIN: 10 g/dL — AB (ref 11.6–15.9)
LYMPH#: 1.4 10*3/uL (ref 0.9–3.3)
LYMPH%: 35.6 % (ref 14.0–49.7)
MCH: 33.2 pg (ref 25.1–34.0)
MCHC: 34.1 g/dL (ref 31.5–36.0)
MCV: 97.3 fL (ref 79.5–101.0)
MONO#: 0.3 10*3/uL (ref 0.1–0.9)
MONO%: 7.6 % (ref 0.0–14.0)
NEUT#: 2 10*3/uL (ref 1.5–6.5)
NEUT%: 49.7 % (ref 38.4–76.8)
PLATELETS: 312 10*3/uL (ref 145–400)
RBC: 3.01 10*6/uL — ABNORMAL LOW (ref 3.70–5.45)
RDW: 16.7 % — ABNORMAL HIGH (ref 11.2–14.5)
WBC: 3.9 10*3/uL (ref 3.9–10.3)
nRBC: 0 % (ref 0–0)

## 2016-05-09 LAB — COMPREHENSIVE METABOLIC PANEL
ALBUMIN: 3.7 g/dL (ref 3.5–5.0)
ALK PHOS: 59 U/L (ref 40–150)
ALT: 16 U/L (ref 0–55)
ANION GAP: 7 meq/L (ref 3–11)
AST: 14 U/L (ref 5–34)
BUN: 10.4 mg/dL (ref 7.0–26.0)
CALCIUM: 9.2 mg/dL (ref 8.4–10.4)
CO2: 26 mEq/L (ref 22–29)
Chloride: 106 mEq/L (ref 98–109)
Creatinine: 0.8 mg/dL (ref 0.6–1.1)
GLUCOSE: 120 mg/dL (ref 70–140)
POTASSIUM: 4 meq/L (ref 3.5–5.1)
SODIUM: 139 meq/L (ref 136–145)
Total Bilirubin: 0.5 mg/dL (ref 0.20–1.20)
Total Protein: 6.7 g/dL (ref 6.4–8.3)

## 2016-05-09 MED ORDER — SODIUM CHLORIDE 0.9% FLUSH
10.0000 mL | INTRAVENOUS | Status: DC | PRN
  Administered 2016-05-09: 10 mL
  Filled 2016-05-09: qty 10

## 2016-05-09 MED ORDER — PROCHLORPERAZINE MALEATE 10 MG PO TABS
10.0000 mg | ORAL_TABLET | Freq: Once | ORAL | Status: AC
Start: 2016-05-09 — End: 2016-05-09
  Administered 2016-05-09: 10 mg via ORAL

## 2016-05-09 MED ORDER — SODIUM CHLORIDE 0.9 % IV SOLN
Freq: Once | INTRAVENOUS | Status: AC
  Administered 2016-05-09: 14:00:00 via INTRAVENOUS

## 2016-05-09 MED ORDER — PROCHLORPERAZINE MALEATE 10 MG PO TABS
ORAL_TABLET | ORAL | Status: AC
  Filled 2016-05-09: qty 1

## 2016-05-09 MED ORDER — HEPARIN SOD (PORK) LOCK FLUSH 100 UNIT/ML IV SOLN
500.0000 [IU] | Freq: Once | INTRAVENOUS | Status: AC | PRN
  Administered 2016-05-09: 500 [IU]
  Filled 2016-05-09: qty 5

## 2016-05-09 MED ORDER — PACLITAXEL PROTEIN-BOUND CHEMO INJECTION 100 MG
80.0000 mg/m2 | Freq: Once | INTRAVENOUS | Status: AC
  Administered 2016-05-09: 150 mg via INTRAVENOUS
  Filled 2016-05-09: qty 30

## 2016-05-09 NOTE — Progress Notes (Signed)
  Echocardiogram 2D Echocardiogram has been performed.  Elizabeth Cunningham 05/09/2016, 9:37 AM

## 2016-05-09 NOTE — Progress Notes (Signed)
[facility] NOTE  Referring Physician: [name]   HPI:  An is 50 y/o womanwith R breast CA T2NO referred by Dr. [name] for enrollment into the [facility].    Breast cancer of upper-inner quadrant of right female breast ([place])   01/20/2016 Initial Diagnosis    Right breast biopsy 12:00: Invasive ductal carcinoma, grade 3, lymphovascular invasion is present, ER 0%, PR 0%, Ki-67 95%, HER-2 negative ratio 1.19; mamm and [place] irregular mass 2.9 x 1.9 x 1.6 cm, no axillary LN T2 N0 stage IIA clinical stage     02/16/2016 -  Neo-Adjuvant Chemotherapy    Dose dense Adriamycin and Cytoxan 4 followed by Abraxane weekly 12      No h/o heart disease. Walks 2 miles per day. Denies any dyspnea, CP or edema.  Received AC x 4 finished July 2017.    Review of Systems: [y] = yes, '[ ]'  = no   General: Weight gain '[ ]' ; Weight loss '[ ]' ; Anorexia '[ ]' ; Fatigue '[ ]' ; Fever '[ ]' ; Chills '[ ]' ; Weakness '[ ]'   Cardiac: Chest pain/pressure '[ ]' ; Resting SOB '[ ]' ; Exertional SOB '[ ]' ; Orthopnea '[ ]' ; Pedal Edema '[ ]' ; Palpitations '[ ]' ; Syncope '[ ]' ; Presyncope '[ ]' ; Paroxysmal nocturnal dyspnea'[ ]'   Pulmonary: Cough '[ ]' ; Wheezing'[ ]' ; Hemoptysis'[ ]' ; Sputum '[ ]' ; Snoring '[ ]'   GI: Vomiting'[ ]' ; Dysphagia'[ ]' ; Melena'[ ]' ; Hematochezia '[ ]' ; Heartburn'[ ]' ; Abdominal pain '[ ]' ; Constipation '[ ]' ; Diarrhea '[ ]' ; BRBPR '[ ]'   GU: Hematuria'[ ]' ; Dysuria '[ ]' ; Nocturia'[ ]'   Vascular: Pain in legs with walking '[ ]' ; Pain in feet with lying flat '[ ]' ; Non-healing sores '[ ]' ; Stroke '[ ]' ; TIA '[ ]' ; Slurred speech '[ ]' ;  Neuro: Headaches'[ ]' ; Vertigo'[ ]' ; Seizures'[ ]' ; Paresthesias'[ ]' ;Blurred vision '[ ]' ; Diplopia '[ ]' ; Vision changes '[ ]'   Ortho/Skin: Arthritis '[ ]' ; Joint pain '[ ]' ; Muscle pain '[ ]' ; Joint swelling '[ ]' ; Back Pain '[ ]' ; Rash '[ ]'   Psych: Depression'[ ]' ; Anxiety'[ ]'   Heme: Bleeding problems '[ ]' ; Clotting disorders '[ ]' ; Anemia '[ ]'   Endocrine: Diabetes '[ ]' ; Thyroid dysfunction'[ ]'    Past Medical History:    Diagnosis Date  . Breast cancer ([place])    triple negative    Current Outpatient Prescriptions  Medication Sig Dispense Refill  . atenolol (TENORMIN) 25 MG tablet Take 25 mg by mouth daily.    . Coenzyme Q10 (CO Q 10 PO) Take 1 capsule by mouth daily. Reported on 02/08/2016    . lidocaine-prilocaine (EMLA) cream Apply to affected area once 30 g 3  . Multiple Vitamins-Minerals (MULTIVITAMIN WITH MINERALS) tablet Take 1 tablet by mouth daily.    . Omega 3 1000 MG CAPS Take 2 capsules by mouth daily.    Marland Kitchen LORazepam (ATIVAN) 0.5 MG tablet Take 1 tablet (0.5 mg total) by mouth at bedtime. (Patient not taking: Reported on 02/08/2016) 30 tablet 0  . ondansetron (ZOFRAN) 8 MG tablet Take 1 tablet (8 mg total) by mouth 2 (two) times daily as needed. Start on the third day after chemotherapy. (Patient not taking: Reported on 02/08/2016) 30 tablet 1  . prochlorperazine (COMPAZINE) 10 MG tablet Take 1 tablet (10 mg total) by mouth every 6 (six) hours as needed (Nausea or vomiting). (Patient not taking: Reported on 02/08/2016) 30 tablet 1   No current facility-administered medications for this encounter.     No Known Allergies    Social History  Social History  . Marital status: Married    Spouse name: N/A  . Number of children: 2  . Years of education: N/A   Occupational History  . Not on file.   Social History Main Topics  . Smoking status: Never Smoker  . Smokeless tobacco: Never Used  . Alcohol use Yes     Comment: occastional  . Drug use: Unknown  . Sexual activity: Not on file   Other Topics Concern  . Not on file   Social History Narrative  . No narrative on file      Family History  Problem Relation Age of Onset  . Adopted: Yes    Vitals:   05/09/16 1008  BP: 130/74  Pulse: 79  SpO2: 100%  Weight: 145 lb 12 oz (66.1 kg)    PHYSICAL EXAM: General:  Well appearing. No respiratory difficulty HEENT: normal Neck: supple. no JVD. Carotids 2+ bilat; no bruits. No  lymphadenopathy or thryomegaly appreciated. Cor: PMI nondisplaced. Regular rate & rhythm. No rubs, gallops or murmurs. Lungs: clear Abdomen: soft, nontender, nondistended. No hepatosplenomegaly. No bruits or masses. Good bowel sounds. Extremities: no cyanosis, clubbing, rash, edema Neuro: alert & oriented x 3, cranial nerves grossly intact. moves all 4 extremities w/o difficulty. Affect pleasant.   ASSESSMENT & PLAN: 1. Breast cancer of upper-inner quadrant of right female breast ([place]) - triple negative --Right breast biopsy 01/20/2016 12:00: Invasive ductal carcinoma, grade 3, lymphovascular invasion is present, ER 0%, PR 0%, Ki-67 95%, HER-2 negative ratio 1.19; mamm and [place] irregular mass 2.9 x 1.9 x 1.6 cm, no axillary LN T2 N0 stage IIA clinical stage -- completed Neoadjuvant chemotherapy with Adriamycin and Cytoxan dose dense 4 in July 2017. Now on  Abraxane weekly 5/12 --I reviewed echos personally. EF and Doppler parameters stable. No HF on exam. Will see back in 6 months for repeat echo to ensure stability. I explained incidence of Adriamycin cardiotoxicity in detail include small possibility of very delayed toxicity.   Jacquelina Hewins,MD 9:39 PM

## 2016-05-09 NOTE — Patient Instructions (Signed)
[facility] Discharge Instructions for Patients Receiving Chemotherapy  Today you received the following chemotherapy agents AABRAXANE To help prevent nausea and vomiting after your treatment, we encourage you to take your nausea medication as prescribed.  If you develop nausea and vomiting that is not controlled by your nausea medication, call the clinic.   BELOW ARE SYMPTOMS THAT SHOULD BE REPORTED IMMEDIATELY:  *FEVER GREATER THAN 100.5 F  *CHILLS WITH OR WITHOUT FEVER  NAUSEA AND VOMITING THAT IS NOT CONTROLLED WITH YOUR NAUSEA MEDICATION  *UNUSUAL SHORTNESS OF BREATH  *UNUSUAL BRUISING OR BLEEDING  TENDERNESS IN MOUTH AND THROAT WITH OR WITHOUT PRESENCE OF ULCERS  *URINARY PROBLEMS  *BOWEL PROBLEMS  UNUSUAL RASH Items with * indicate a potential emergency and should be followed up as soon as possible.  Feel free to call the clinic you have any questions or concerns. The clinic phone number is [phone].  Please show the [facility] at check-in to the Emergency Department and triage nurse.

## 2016-05-09 NOTE — Progress Notes (Signed)
Okay to treat with CMet obtained on 05/02/16 per Dr. [name].

## 2016-05-10 ENCOUNTER — Telehealth: Payer: Self-pay | Admitting: *Deleted

## 2016-05-10 NOTE — Telephone Encounter (Signed)
Pt called with c/o a "cracked tooth". Relate it "isn't swollen and Tylenol is taking care of the discomfort". Pt informed she may need a root canal. Per Dr. [name] pt may have if necessary and he will place the pt on abx. Gave information to pt. Denies further needs at this time.

## 2016-05-16 ENCOUNTER — Encounter: Payer: Self-pay | Admitting: Hematology and Oncology

## 2016-05-16 ENCOUNTER — Other Ambulatory Visit (HOSPITAL_BASED_OUTPATIENT_CLINIC_OR_DEPARTMENT_OTHER): Payer: Managed Care, Other (non HMO)

## 2016-05-16 ENCOUNTER — Ambulatory Visit (HOSPITAL_BASED_OUTPATIENT_CLINIC_OR_DEPARTMENT_OTHER): Payer: Managed Care, Other (non HMO)

## 2016-05-16 ENCOUNTER — Ambulatory Visit (HOSPITAL_BASED_OUTPATIENT_CLINIC_OR_DEPARTMENT_OTHER): Payer: Managed Care, Other (non HMO) | Admitting: Hematology and Oncology

## 2016-05-16 DIAGNOSIS — C50211 Malignant neoplasm of upper-inner quadrant of right female breast: Secondary | ICD-10-CM | POA: Diagnosis not present

## 2016-05-16 DIAGNOSIS — R530 Neoplastic (malignant) related fatigue: Secondary | ICD-10-CM | POA: Diagnosis not present

## 2016-05-16 DIAGNOSIS — L658 Other specified nonscarring hair loss: Secondary | ICD-10-CM | POA: Diagnosis not present

## 2016-05-16 DIAGNOSIS — Z5111 Encounter for antineoplastic chemotherapy: Secondary | ICD-10-CM

## 2016-05-16 LAB — CBC WITH DIFFERENTIAL/PLATELET
BASO%: 1.4 % (ref 0.0–2.0)
BASOS ABS: 0 10*3/uL (ref 0.0–0.1)
EOS ABS: 0.1 10*3/uL (ref 0.0–0.5)
EOS%: 2.9 % (ref 0.0–7.0)
HEMATOCRIT: 29.4 % — AB (ref 34.8–46.6)
HEMOGLOBIN: 10 g/dL — AB (ref 11.6–15.9)
LYMPH#: 0.7 10*3/uL — AB (ref 0.9–3.3)
LYMPH%: 23.1 % (ref 14.0–49.7)
MCH: 33.7 pg (ref 25.1–34.0)
MCHC: 34.1 g/dL (ref 31.5–36.0)
MCV: 98.7 fL (ref 79.5–101.0)
MONO#: 0.3 10*3/uL (ref 0.1–0.9)
MONO%: 9.1 % (ref 0.0–14.0)
NEUT#: 1.9 10*3/uL (ref 1.5–6.5)
NEUT%: 63.5 % (ref 38.4–76.8)
PLATELETS: 232 10*3/uL (ref 145–400)
RBC: 2.98 10*6/uL — ABNORMAL LOW (ref 3.70–5.45)
RDW: 16.9 % — AB (ref 11.2–14.5)
WBC: 3.1 10*3/uL — ABNORMAL LOW (ref 3.9–10.3)

## 2016-05-16 LAB — COMPREHENSIVE METABOLIC PANEL
ALT: 20 U/L (ref 0–55)
ANION GAP: 10 meq/L (ref 3–11)
AST: 18 U/L (ref 5–34)
Albumin: 4.1 g/dL (ref 3.5–5.0)
Alkaline Phosphatase: 60 U/L (ref 40–150)
BUN: 10.3 mg/dL (ref 7.0–26.0)
CHLORIDE: 104 meq/L (ref 98–109)
CO2: 25 meq/L (ref 22–29)
Calcium: 9.6 mg/dL (ref 8.4–10.4)
Creatinine: 0.7 mg/dL (ref 0.6–1.1)
GLUCOSE: 106 mg/dL (ref 70–140)
POTASSIUM: 3.9 meq/L (ref 3.5–5.1)
SODIUM: 139 meq/L (ref 136–145)
TOTAL PROTEIN: 7.2 g/dL (ref 6.4–8.3)
Total Bilirubin: 0.78 mg/dL (ref 0.20–1.20)

## 2016-05-16 MED ORDER — SODIUM CHLORIDE 0.9% FLUSH
10.0000 mL | INTRAVENOUS | Status: DC | PRN
  Filled 2016-05-16: qty 10

## 2016-05-16 MED ORDER — PROCHLORPERAZINE MALEATE 10 MG PO TABS
10.0000 mg | ORAL_TABLET | Freq: Once | ORAL | Status: AC
  Administered 2016-05-16: 10 mg via ORAL

## 2016-05-16 MED ORDER — HEPARIN SOD (PORK) LOCK FLUSH 100 UNIT/ML IV SOLN
500.0000 [IU] | Freq: Once | INTRAVENOUS | Status: DC | PRN
  Filled 2016-05-16: qty 5

## 2016-05-16 MED ORDER — SODIUM CHLORIDE 0.9 % IV SOLN
Freq: Once | INTRAVENOUS | Status: AC
  Administered 2016-05-16: 10:00:00 via INTRAVENOUS

## 2016-05-16 MED ORDER — PROCHLORPERAZINE MALEATE 10 MG PO TABS
ORAL_TABLET | ORAL | Status: AC
  Filled 2016-05-16: qty 1

## 2016-05-16 MED ORDER — PACLITAXEL PROTEIN-BOUND CHEMO INJECTION 100 MG
80.0000 mg/m2 | Freq: Once | INTRAVENOUS | Status: AC
  Administered 2016-05-16: 150 mg via INTRAVENOUS
  Filled 2016-05-16: qty 30

## 2016-05-16 NOTE — Patient Instructions (Signed)
[facility] Discharge Instructions for Patients Receiving Chemotherapy  Today you received the following chemotherapy agents Abraxane To help prevent nausea and vomiting after your treatment, we encourage you to take your nausea medication as prescribed.   If you develop nausea and vomiting that is not controlled by your nausea medication, call the clinic.   BELOW ARE SYMPTOMS THAT SHOULD BE REPORTED IMMEDIATELY:  *FEVER GREATER THAN 100.5 F  *CHILLS WITH OR WITHOUT FEVER  NAUSEA AND VOMITING THAT IS NOT CONTROLLED WITH YOUR NAUSEA MEDICATION  *UNUSUAL SHORTNESS OF BREATH  *UNUSUAL BRUISING OR BLEEDING  TENDERNESS IN MOUTH AND THROAT WITH OR WITHOUT PRESENCE OF ULCERS  *URINARY PROBLEMS  *BOWEL PROBLEMS  UNUSUAL RASH Items with * indicate a potential emergency and should be followed up as soon as possible.  Feel free to call the clinic you have any questions or concerns. The clinic phone number is [phone].  Please show the CHEMO ALERT CARD at check-in to the Emergency Department and triage nurse.   

## 2016-05-16 NOTE — Assessment & Plan Note (Signed)
Right breast biopsy 01/20/2016 12:00: Invasive ductal carcinoma, grade 3, lymphovascular invasion is present, ER 0%, PR 0%, Ki-67 95%, HER-2 negative ratio 1.19; mamm and [place] irregular mass 2.9 x 1.9 x 1.6 cm, no axillary LN T2 N0 stage IIA clinical stage  Treatment planbased on multidisciplinary tumor board: 1. Neoadjuvant chemotherapy with Adriamycin and Cytoxan dose dense 4 followed by Abraxane weekly 12 2. Followed by breast conserving surgery with sentinel lymph node study vs bilateral mastectomies. (patient appears to be very worried that the mammograms did not pick up her breast cancer with mammograms may not be sufficient) 3. Followed by adjuvant radiation therapy 4. Genetic counseling and testing on 03/26/2016 -------------------------------------------------------------------------------------- Current treatment: Completed 4 cycles of dose dense Adriamycin and Cytoxan, today is cycle 6/12 Abraxane  Chemotherapy toxicities: 1. Chemotherapy-induced Grade 2 anemia: hemoglobin 9.2, We are monitoring closely,awaiting today's labs 2. Bone pain related to Neulasta: No longer a problem 3. Alopecia  Denies any nausea vomiting, fevers or chills.  Return to clinic in 2weeks withcycle 8Abraxane

## 2016-05-16 NOTE — Progress Notes (Signed)
Patient Care Team: Vernie Shanks, MD as PCP - General (Family Medicine)  SUMMARY OF ONCOLOGIC HISTORY:   Breast cancer of upper-inner quadrant of right female breast ([place])   01/20/2016 Initial Diagnosis    Right breast biopsy 12:00: Invasive ductal carcinoma, grade 3, lymphovascular invasion is present, ER 0%, PR 0%, Ki-67 95%, HER-2 negative ratio 1.19; mamm and [place] irregular mass 2.9 x 1.9 x 1.6 cm, no axillary LN T2 N0 stage IIA clinical stage      02/16/2016 -  Neo-Adjuvant Chemotherapy    Dose dense Adriamycin and Cytoxan 4 followed by Abraxane weekly 12      CHIEF COMPLIANT: Cycle 6 Abraxane  INTERVAL HISTORY: Elizabeth Cunningham is a 50 year old with above-mentioned history of right breast cancer currently on neoadjuvant chemotherapy today is cycle 6 of Abraxane. She is doing reasonably well from chemotherapy standpoint. She does have fatigue related to treatment. Denies any nausea vomiting or fevers or chills. Denies any neuropathy.  REVIEW OF SYSTEMS:   Constitutional: Denies fevers, chills or abnormal weight loss Eyes: Denies blurriness of vision Ears, nose, mouth, throat, and face: Denies mucositis or sore throat Respiratory: Denies cough, dyspnea or wheezes Cardiovascular: Denies palpitation, chest discomfort Gastrointestinal:  Denies nausea, heartburn or change in bowel habits Skin: Denies abnormal skin rashes Lymphatics: Denies new lymphadenopathy or easy bruising Neurological:Denies numbness, tingling or new weaknesses Behavioral/Psych: Mood is stable, no new changes  Extremities: No lower extremity edema  All other systems were reviewed with the patient and are negative.  I have reviewed the past medical history, past surgical history, social history and family history with the patient and they are unchanged from previous note.  ALLERGIES:  has No Known Allergies.  MEDICATIONS:  Current Outpatient Prescriptions  Medication Sig Dispense Refill  . atenolol  (TENORMIN) 25 MG tablet Take 25 mg by mouth daily.    . Coenzyme Q10 (CO Q 10 PO) Take 1 capsule by mouth daily. Reported on 02/08/2016    . lidocaine-prilocaine (EMLA) cream Apply to affected area once 30 g 3  . LORazepam (ATIVAN) 0.5 MG tablet Take 1 tablet (0.5 mg total) by mouth at bedtime. (Patient not taking: Reported on 02/08/2016) 30 tablet 0  . Multiple Vitamins-Minerals (MULTIVITAMIN WITH MINERALS) tablet Take 1 tablet by mouth daily.    . Omega 3 1000 MG CAPS Take 2 capsules by mouth daily.    . ondansetron (ZOFRAN) 8 MG tablet Take 1 tablet (8 mg total) by mouth 2 (two) times daily as needed. Start on the third day after chemotherapy. (Patient not taking: Reported on 02/08/2016) 30 tablet 1  . prochlorperazine (COMPAZINE) 10 MG tablet Take 1 tablet (10 mg total) by mouth every 6 (six) hours as needed (Nausea or vomiting). (Patient not taking: Reported on 02/08/2016) 30 tablet 1   No current facility-administered medications for this visit.     PHYSICAL EXAMINATION: ECOG PERFORMANCE STATUS: 1 - Symptomatic but completely ambulatory  There were no vitals filed for this visit. There were no vitals filed for this visit.  GENERAL:alert, no distress and comfortable SKIN: skin color, texture, turgor are normal, no rashes or significant lesions EYES: normal, Conjunctiva are pink and non-injected, sclera clear OROPHARYNX:no exudate, no erythema and lips, buccal mucosa, and tongue normal  NECK: supple, thyroid normal size, non-tender, without nodularity LYMPH:  no palpable lymphadenopathy in the cervical, axillary or inguinal LUNGS: clear to auscultation and percussion with normal breathing effort HEART: regular rate & rhythm and no murmurs and no lower  extremity edema ABDOMEN:abdomen soft, non-tender and normal bowel sounds MUSCULOSKELETAL:no cyanosis of digits and no clubbing  NEURO: alert & oriented x 3 with fluent speech, no focal motor/sensory deficits EXTREMITIES: No lower extremity  edema  LABORATORY DATA:  I have reviewed the data as listed   Chemistry      Component Value Date/Time   NA 139 05/09/2016 1315   K 4.0 05/09/2016 1315   CL 102 02/03/2016 1129   CO2 26 05/09/2016 1315   BUN 10.4 05/09/2016 1315   CREATININE 0.8 05/09/2016 1315      Component Value Date/Time   CALCIUM 9.2 05/09/2016 1315   ALKPHOS 59 05/09/2016 1315   AST 14 05/09/2016 1315   ALT 16 05/09/2016 1315   BILITOT 0.50 05/09/2016 1315       Lab Results  Component Value Date   WBC 3.9 05/09/2016   HGB 10.0 (L) 05/09/2016   HCT 29.3 (L) 05/09/2016   MCV 97.3 05/09/2016   PLT 312 05/09/2016   NEUTROABS 2.0 05/09/2016     ASSESSMENT & PLAN:  Breast cancer of upper-inner quadrant of right female breast ([place]) Right breast biopsy 01/20/2016 12:00: Invasive ductal carcinoma, grade 3, lymphovascular invasion is present, ER 0%, PR 0%, Ki-67 95%, HER-2 negative ratio 1.19; mamm and [place] irregular mass 2.9 x 1.9 x 1.6 cm, no axillary LN T2 N0 stage IIA clinical stage  Treatment planbased on multidisciplinary tumor board: 1. Neoadjuvant chemotherapy with Adriamycin and Cytoxan dose dense 4 followed by Abraxane weekly 12 2. Followed by breast conserving surgery with sentinel lymph node study vs bilateral mastectomies. (patient appears to be very worried that the mammograms did not pick up her breast cancer with mammograms may not be sufficient) 3. Followed by adjuvant radiation therapy 4. Genetic counseling and testing on 03/26/2016 -------------------------------------------------------------------------------------- Current treatment: Completed 4 cycles of dose dense Adriamycin and Cytoxan, today is cycle 6/12 Abraxane  Chemotherapy toxicities: 1. Chemotherapy-induced Grade 2 anemia: hemoglobin 9.2, We are monitoring closely,awaiting today's labs 2. Bone pain related to Neulasta: No longer a problem 3. Alopecia  Denies any nausea vomiting, fevers or chills.  Return to clinic  in 2weeks withcycle 8Abraxane Patient has recently moved to [facility] and takes about an hour and 15 minutes for her to get to her treatments.  No orders of the defined types were placed in this encounter.  The patient has a good understanding of the overall plan. she agrees with it. she will call with any problems that may develop before the next visit here.   Rulon Eisenmenger, MD 05/16/16

## 2016-05-23 ENCOUNTER — Ambulatory Visit (HOSPITAL_BASED_OUTPATIENT_CLINIC_OR_DEPARTMENT_OTHER): Payer: Managed Care, Other (non HMO)

## 2016-05-23 ENCOUNTER — Other Ambulatory Visit (HOSPITAL_BASED_OUTPATIENT_CLINIC_OR_DEPARTMENT_OTHER): Payer: Managed Care, Other (non HMO)

## 2016-05-23 VITALS — BP 127/87 | HR 80 | Temp 98.1°F | Resp 18

## 2016-05-23 DIAGNOSIS — Z5111 Encounter for antineoplastic chemotherapy: Secondary | ICD-10-CM | POA: Diagnosis not present

## 2016-05-23 DIAGNOSIS — C50211 Malignant neoplasm of upper-inner quadrant of right female breast: Secondary | ICD-10-CM | POA: Diagnosis not present

## 2016-05-23 LAB — COMPREHENSIVE METABOLIC PANEL
ALT: 17 U/L (ref 0–55)
ANION GAP: 9 meq/L (ref 3–11)
AST: 18 U/L (ref 5–34)
Albumin: 4.1 g/dL (ref 3.5–5.0)
Alkaline Phosphatase: 63 U/L (ref 40–150)
BILIRUBIN TOTAL: 0.63 mg/dL (ref 0.20–1.20)
BUN: 8 mg/dL (ref 7.0–26.0)
CALCIUM: 9.6 mg/dL (ref 8.4–10.4)
CHLORIDE: 106 meq/L (ref 98–109)
CO2: 26 mEq/L (ref 22–29)
CREATININE: 0.7 mg/dL (ref 0.6–1.1)
EGFR: >90 mL/min/{1.73_m2} (ref >90)
Glucose: 112 mg/dl (ref 70–140)
Potassium: 4.2 mEq/L (ref 3.5–5.1)
Sodium: 141 mEq/L (ref 136–145)
TOTAL PROTEIN: 7.5 g/dL (ref 6.4–8.3)

## 2016-05-23 LAB — CBC WITH DIFFERENTIAL/PLATELET
BASO%: 1.2 % (ref 0.0–2.0)
BASOS ABS: 0 10*3/uL (ref 0.0–0.1)
EOS ABS: 0.1 10*3/uL (ref 0.0–0.5)
EOS%: 3.4 % (ref 0.0–7.0)
HCT: 33.1 % — ABNORMAL LOW (ref 34.8–46.6)
HGB: 11.2 g/dL — ABNORMAL LOW (ref 11.6–15.9)
LYMPH%: 20.5 % (ref 14.0–49.7)
MCH: 33.7 pg (ref 25.1–34.0)
MCHC: 33.8 g/dL (ref 31.5–36.0)
MCV: 99.7 fL (ref 79.5–101.0)
MONO#: 0.3 10*3/uL (ref 0.1–0.9)
MONO%: 8 % (ref 0.0–14.0)
NEUT%: 66.9 % (ref 38.4–76.8)
NEUTROS ABS: 2.8 10*3/uL (ref 1.5–6.5)
Platelets: 291 10*3/uL (ref 145–400)
RBC: 3.31 10*6/uL — AB (ref 3.70–5.45)
RDW: 15.7 % — ABNORMAL HIGH (ref 11.2–14.5)
WBC: 4.1 10*3/uL (ref 3.9–10.3)
lymph#: 0.8 10*3/uL — ABNORMAL LOW (ref 0.9–3.3)

## 2016-05-23 MED ORDER — SODIUM CHLORIDE 0.9 % IV SOLN
Freq: Once | INTRAVENOUS | Status: AC
  Administered 2016-05-23: 10:00:00 via INTRAVENOUS

## 2016-05-23 MED ORDER — HEPARIN SOD (PORK) LOCK FLUSH 100 UNIT/ML IV SOLN
500.0000 [IU] | Freq: Once | INTRAVENOUS | Status: AC | PRN
  Administered 2016-05-23: 500 [IU]
  Filled 2016-05-23: qty 5

## 2016-05-23 MED ORDER — PROCHLORPERAZINE MALEATE 10 MG PO TABS
ORAL_TABLET | ORAL | Status: AC
  Filled 2016-05-23: qty 1

## 2016-05-23 MED ORDER — PROCHLORPERAZINE MALEATE 10 MG PO TABS
10.0000 mg | ORAL_TABLET | Freq: Once | ORAL | Status: AC
  Administered 2016-05-23: 10 mg via ORAL

## 2016-05-23 MED ORDER — PACLITAXEL PROTEIN-BOUND CHEMO INJECTION 100 MG
80.0000 mg/m2 | Freq: Once | INTRAVENOUS | Status: AC
  Administered 2016-05-23: 150 mg via INTRAVENOUS
  Filled 2016-05-23: qty 30

## 2016-05-23 MED ORDER — SODIUM CHLORIDE 0.9% FLUSH
10.0000 mL | INTRAVENOUS | Status: DC | PRN
  Administered 2016-05-23: 10 mL
  Filled 2016-05-23: qty 10

## 2016-05-23 NOTE — Patient Instructions (Signed)
[facility] Discharge Instructions for Patients Receiving Chemotherapy  Today you received the following chemotherapy agents Abraxane To help prevent nausea and vomiting after your treatment, we encourage you to take your nausea medication as prescribed.   If you develop nausea and vomiting that is not controlled by your nausea medication, call the clinic.   BELOW ARE SYMPTOMS THAT SHOULD BE REPORTED IMMEDIATELY:  *FEVER GREATER THAN 100.5 F  *CHILLS WITH OR WITHOUT FEVER  NAUSEA AND VOMITING THAT IS NOT CONTROLLED WITH YOUR NAUSEA MEDICATION  *UNUSUAL SHORTNESS OF BREATH  *UNUSUAL BRUISING OR BLEEDING  TENDERNESS IN MOUTH AND THROAT WITH OR WITHOUT PRESENCE OF ULCERS  *URINARY PROBLEMS  *BOWEL PROBLEMS  UNUSUAL RASH Items with * indicate a potential emergency and should be followed up as soon as possible.  Feel free to call the clinic you have any questions or concerns. The clinic phone number is [phone].  Please show the CHEMO ALERT CARD at check-in to the Emergency Department and triage nurse.   

## 2016-05-29 NOTE — Assessment & Plan Note (Signed)
Right breast biopsy 01/20/2016 12:00: Invasive ductal carcinoma, grade 3, lymphovascular invasion is present, ER 0%, PR 0%, Ki-67 95%, HER-2 negative ratio 1.19; mamm and [place] irregular mass 2.9 x 1.9 x 1.6 cm, no axillary LN T2 N0 stage IIA clinical stage  Treatment planbased on multidisciplinary tumor board: 1. Neoadjuvant chemotherapy with Adriamycin and Cytoxan dose dense 4 followed by Abraxane weekly 12 2. Followed by breast conserving surgery with sentinel lymph node study vs bilateral mastectomies. (patient appears to be very worried that the mammograms did not pick up her breast cancer with mammograms may not be sufficient) 3. Followed by adjuvant radiation therapy 4. Genetic counseling and testing on 03/26/2016 -------------------------------------------------------------------------------------- Current treatment: Completed 4 cycles of dose dense Adriamycin and Cytoxan, today is cycle 8/12 Abraxane  Chemotherapy toxicities: 1. Chemotherapy-induced Grade 2 anemia: hemoglobin 9.2, We are monitoring closely,awaiting today's labs 2. Bone pain related to Neulasta: No longer a problem 3. Alopecia  Denies any nausea vomiting, fevers or chills.  Return to clinic in 2weeks withcycle 10Abraxane Patient has recently moved to [facility] and takes about an hour and 15 minutes for her to get to her treatments.

## 2016-05-30 ENCOUNTER — Ambulatory Visit (HOSPITAL_BASED_OUTPATIENT_CLINIC_OR_DEPARTMENT_OTHER): Payer: Managed Care, Other (non HMO) | Admitting: Hematology and Oncology

## 2016-05-30 ENCOUNTER — Ambulatory Visit (HOSPITAL_BASED_OUTPATIENT_CLINIC_OR_DEPARTMENT_OTHER): Payer: Managed Care, Other (non HMO)

## 2016-05-30 ENCOUNTER — Encounter: Payer: Self-pay | Admitting: Hematology and Oncology

## 2016-05-30 ENCOUNTER — Other Ambulatory Visit (HOSPITAL_BASED_OUTPATIENT_CLINIC_OR_DEPARTMENT_OTHER): Payer: Managed Care, Other (non HMO)

## 2016-05-30 DIAGNOSIS — C50211 Malignant neoplasm of upper-inner quadrant of right female breast: Secondary | ICD-10-CM

## 2016-05-30 DIAGNOSIS — Z5112 Encounter for antineoplastic immunotherapy: Secondary | ICD-10-CM

## 2016-05-30 DIAGNOSIS — L658 Other specified nonscarring hair loss: Secondary | ICD-10-CM

## 2016-05-30 DIAGNOSIS — D6481 Anemia due to antineoplastic chemotherapy: Secondary | ICD-10-CM | POA: Diagnosis not present

## 2016-05-30 LAB — CBC WITH DIFFERENTIAL/PLATELET
BASO%: 1.4 % (ref 0.0–2.0)
BASOS ABS: 0 10*3/uL (ref 0.0–0.1)
EOS ABS: 0.1 10*3/uL (ref 0.0–0.5)
EOS%: 3.2 % (ref 0.0–7.0)
HEMATOCRIT: 31.6 % — AB (ref 34.8–46.6)
HEMOGLOBIN: 10.9 g/dL — AB (ref 11.6–15.9)
LYMPH%: 24.5 % (ref 14.0–49.7)
MCH: 34.3 pg — AB (ref 25.1–34.0)
MCHC: 34.5 g/dL (ref 31.5–36.0)
MCV: 99.6 fL (ref 79.5–101.0)
MONO#: 0.2 10*3/uL (ref 0.1–0.9)
MONO%: 8.7 % (ref 0.0–14.0)
NEUT#: 1.8 10*3/uL (ref 1.5–6.5)
NEUT%: 62.2 % (ref 38.4–76.8)
Platelets: 263 10*3/uL (ref 145–400)
RBC: 3.17 10*6/uL — ABNORMAL LOW (ref 3.70–5.45)
RDW: 14.7 % — AB (ref 11.2–14.5)
WBC: 2.9 10*3/uL — ABNORMAL LOW (ref 3.9–10.3)
lymph#: 0.7 10*3/uL — ABNORMAL LOW (ref 0.9–3.3)

## 2016-05-30 LAB — COMPREHENSIVE METABOLIC PANEL
ALT: 17 U/L (ref 0–55)
ANION GAP: 10 meq/L (ref 3–11)
AST: 13 U/L (ref 5–34)
Albumin: 3.8 g/dL (ref 3.5–5.0)
Alkaline Phosphatase: 61 U/L (ref 40–150)
BILIRUBIN TOTAL: 0.67 mg/dL (ref 0.20–1.20)
BUN: 9.2 mg/dL (ref 7.0–26.0)
CHLORIDE: 105 meq/L (ref 98–109)
CO2: 25 meq/L (ref 22–29)
CREATININE: 0.7 mg/dL (ref 0.6–1.1)
Calcium: 9.4 mg/dL (ref 8.4–10.4)
EGFR: >90 mL/min/{1.73_m2} (ref >90)
GLUCOSE: 132 mg/dL (ref 70–140)
Potassium: 4 mEq/L (ref 3.5–5.1)
SODIUM: 140 meq/L (ref 136–145)
TOTAL PROTEIN: 7.1 g/dL (ref 6.4–8.3)

## 2016-05-30 MED ORDER — SODIUM CHLORIDE 0.9% FLUSH
10.0000 mL | INTRAVENOUS | Status: DC | PRN
  Administered 2016-05-30: 10 mL
  Filled 2016-05-30: qty 10

## 2016-05-30 MED ORDER — SODIUM CHLORIDE 0.9 % IV SOLN
Freq: Once | INTRAVENOUS | Status: AC
  Administered 2016-05-30: 10:00:00 via INTRAVENOUS

## 2016-05-30 MED ORDER — PACLITAXEL PROTEIN-BOUND CHEMO INJECTION 100 MG
80.0000 mg/m2 | Freq: Once | INTRAVENOUS | Status: AC
  Administered 2016-05-30: 150 mg via INTRAVENOUS
  Filled 2016-05-30: qty 30

## 2016-05-30 MED ORDER — PROCHLORPERAZINE MALEATE 10 MG PO TABS
ORAL_TABLET | ORAL | Status: AC
  Filled 2016-05-30: qty 1

## 2016-05-30 MED ORDER — HEPARIN SOD (PORK) LOCK FLUSH 100 UNIT/ML IV SOLN
500.0000 [IU] | Freq: Once | INTRAVENOUS | Status: AC | PRN
  Administered 2016-05-30: 500 [IU]
  Filled 2016-05-30: qty 5

## 2016-05-30 MED ORDER — PROCHLORPERAZINE MALEATE 10 MG PO TABS
10.0000 mg | ORAL_TABLET | Freq: Once | ORAL | Status: AC
  Administered 2016-05-30: 10 mg via ORAL

## 2016-05-30 NOTE — Progress Notes (Signed)
Patient Care Team: Vernie Shanks, MD as PCP - General (Family Medicine)  SUMMARY OF ONCOLOGIC HISTORY:   Breast cancer of upper-inner quadrant of right female breast (Elizabeth Cunningham)   01/20/2016 Initial Diagnosis    Right breast biopsy 12:00: Invasive ductal carcinoma, grade 3, lymphovascular invasion is present, ER 0%, PR 0%, Ki-67 95%, HER-2 negative ratio 1.19; mamm and [place] irregular mass 2.9 x 1.9 x 1.6 cm, no axillary LN T2 N0 stage IIA clinical stage      02/16/2016 -  Neo-Adjuvant Chemotherapy    Dose dense Adriamycin and Cytoxan 4 followed by Abraxane weekly 12       CHIEF COMPLIANT: Cycle 8 of Abraxane  INTERVAL HISTORY: Elizabeth Cunningham is a 50 year old with above-mentioned history right breast cancer who is currently on neoadjuvant chemotherapy and today is cycle 8 of Abraxane. She has been tolerating Abraxane fairly well. She denies neuropathy. She continues to feel moderately fatigued. Denies any nausea vomiting or diarrhea or constipation. Denies any fevers or chills.  REVIEW OF SYSTEMS:   Constitutional: Denies fevers, chills or abnormal weight loss Eyes: Denies blurriness of vision Ears, nose, mouth, throat, and face: Denies mucositis or sore throat Respiratory: Denies cough, dyspnea or wheezes Cardiovascular: Denies palpitation, chest discomfort Gastrointestinal:  Denies nausea, heartburn or change in bowel habits Skin: Denies abnormal skin rashes Lymphatics: Denies new lymphadenopathy or easy bruising Neurological:Denies numbness, tingling or new weaknesses Behavioral/Psych: Mood is stable, no new changes  Extremities: No lower extremity edema All other systems were reviewed with the patient and are negative.  I have reviewed the past medical history, past surgical history, social history and family history with the patient and they are unchanged from previous note.  ALLERGIES:  has No Known Allergies.  MEDICATIONS:  Current Outpatient Prescriptions  Medication Sig  Dispense Refill  . atenolol (TENORMIN) 25 MG tablet Take 25 mg by mouth daily.    . Coenzyme Q10 (CO Q 10 PO) Take 1 capsule by mouth daily. Reported on 02/08/2016    . lidocaine-prilocaine (EMLA) cream Apply to affected area once 30 g 3  . LORazepam (ATIVAN) 0.5 MG tablet Take 1 tablet (0.5 mg total) by mouth at bedtime. (Patient not taking: Reported on 02/08/2016) 30 tablet 0  . Multiple Vitamins-Minerals (MULTIVITAMIN WITH MINERALS) tablet Take 1 tablet by mouth daily.    . Omega 3 1000 MG CAPS Take 2 capsules by mouth daily.    . ondansetron (ZOFRAN) 8 MG tablet Take 1 tablet (8 mg total) by mouth 2 (two) times daily as needed. Start on the third day after chemotherapy. (Patient not taking: Reported on 02/08/2016) 30 tablet 1  . prochlorperazine (COMPAZINE) 10 MG tablet Take 1 tablet (10 mg total) by mouth every 6 (six) hours as needed (Nausea or vomiting). (Patient not taking: Reported on 02/08/2016) 30 tablet 1   No current facility-administered medications for this visit.     PHYSICAL EXAMINATION: ECOG PERFORMANCE STATUS: 1 - Symptomatic but completely ambulatory  Vitals:   05/30/16 0824  BP: (!) 145/78  Pulse: 72  Resp: 18  Temp: 97.9 F (36.6 C)   Filed Weights   05/30/16 0824  Weight: 145 lb 9.6 oz (66 kg)    GENERAL:alert, no distress and comfortable SKIN: skin color, texture, turgor are normal, no rashes or significant lesions EYES: normal, Conjunctiva are pink and non-injected, sclera clear OROPHARYNX:no exudate, no erythema and lips, buccal mucosa, and tongue normal  NECK: supple, thyroid normal size, non-tender, without nodularity LYMPH:  no  palpable lymphadenopathy in the cervical, axillary or inguinal LUNGS: clear to auscultation and percussion with normal breathing effort HEART: regular rate & rhythm and no murmurs and no lower extremity edema ABDOMEN:abdomen soft, non-tender and normal bowel sounds MUSCULOSKELETAL:no cyanosis of digits and no clubbing  NEURO:  alert & oriented x 3 with fluent speech, no focal motor/sensory deficits EXTREMITIES: No lower extremity edema  LABORATORY DATA:  I have reviewed the data as listed   Chemistry      Component Value Date/Time   NA 140 05/30/2016 0814   K 4.0 05/30/2016 0814   CL 102 02/03/2016 1129   CO2 25 05/30/2016 0814   BUN 9.2 05/30/2016 0814   CREATININE 0.7 05/30/2016 0814      Component Value Date/Time   CALCIUM 9.4 05/30/2016 0814   ALKPHOS 61 05/30/2016 0814   AST 13 05/30/2016 0814   ALT 17 05/30/2016 0814   BILITOT 0.67 05/30/2016 0814     Lab Results  Component Value Date   WBC 2.9 (L) 05/30/2016   HGB 10.9 (L) 05/30/2016   HCT 31.6 (L) 05/30/2016   MCV 99.6 05/30/2016   PLT 263 05/30/2016   NEUTROABS 1.8 05/30/2016   ASSESSMENT & PLAN:  Breast cancer of upper-inner quadrant of right female breast ([place]) Right breast biopsy 01/20/2016 12:00: Invasive ductal carcinoma, grade 3, lymphovascular invasion is present, ER 0%, PR 0%, Ki-67 95%, HER-2 negative ratio 1.19; mamm and [place] irregular mass 2.9 x 1.9 x 1.6 cm, no axillary LN T2 N0 stage IIA clinical stage  Treatment planbased on multidisciplinary tumor board: 1. Neoadjuvant chemotherapy with Adriamycin and Cytoxan dose dense 4 followed by Abraxane weekly 12 2. Followed by breast conserving surgery with sentinel lymph node study vs bilateral mastectomies. (patient appears to be very worried that the mammograms did not pick up her breast cancer with mammograms may not be sufficient) 3. Followed by adjuvant radiation therapy 4. Genetic counseling and testing on 03/26/2016 -------------------------------------------------------------------------------------- Current treatment: Completed 4 cycles of dose dense Adriamycin and Cytoxan, today is cycle 8/12 Abraxane  Chemotherapy toxicities: 1. Chemotherapy-induced Grade 2 anemia: hemoglobin 9.2, We are monitoring closely,awaiting today's labs 2. Bone pain related to Neulasta: No  longer a problem 3. Alopecia  Denies any nausea vomiting, fevers or chills.  Return to clinic in 2weeks withcycle 10Abraxane Patient has recently moved to [facility] and takes about an hour and 15 minutes for her to get to her treatments. We discussed about the plan after she completes with chemotherapy. She is most likely continue to a breast MRI followed by tumor board presentation and surgery evaluation. She is still debating whether she wants to do bilateral mastectomy are rest conserving surgery. Patient has a vacation planned to [place] and she may have to cancel her trip if she undergoes bilateral mastectomies. Her concern is related to the difficulty with identifying the cancer.  No orders of the defined types were placed in this encounter.  The patient has a good understanding of the overall plan. she agrees with it. she will call with any problems that may develop before the next visit here.   Rulon Eisenmenger, MD 05/30/16

## 2016-05-30 NOTE — Patient Instructions (Signed)
[facility] Discharge Instructions for Patients Receiving Chemotherapy  Today you received the following chemotherapy agents: Abraxane   To help prevent nausea and vomiting after your treatment, we encourage you to take your nausea medication as directed.    If you develop nausea and vomiting that is not controlled by your nausea medication, call the clinic.   BELOW ARE SYMPTOMS THAT SHOULD BE REPORTED IMMEDIATELY:  *FEVER GREATER THAN 100.5 F  *CHILLS WITH OR WITHOUT FEVER  NAUSEA AND VOMITING THAT IS NOT CONTROLLED WITH YOUR NAUSEA MEDICATION  *UNUSUAL SHORTNESS OF BREATH  *UNUSUAL BRUISING OR BLEEDING  TENDERNESS IN MOUTH AND THROAT WITH OR WITHOUT PRESENCE OF ULCERS  *URINARY PROBLEMS  *BOWEL PROBLEMS  UNUSUAL RASH Items with * indicate a potential emergency and should be followed up as soon as possible.  Feel free to call the clinic you have any questions or concerns. The clinic phone number is [phone].  Please show the CHEMO ALERT CARD at check-in to the Emergency Department and triage nurse.   

## 2016-06-06 ENCOUNTER — Ambulatory Visit (HOSPITAL_BASED_OUTPATIENT_CLINIC_OR_DEPARTMENT_OTHER): Payer: Managed Care, Other (non HMO)

## 2016-06-06 ENCOUNTER — Other Ambulatory Visit (HOSPITAL_BASED_OUTPATIENT_CLINIC_OR_DEPARTMENT_OTHER): Payer: Managed Care, Other (non HMO)

## 2016-06-06 VITALS — BP 152/85 | HR 65 | Temp 98.4°F | Resp 16

## 2016-06-06 DIAGNOSIS — Z5111 Encounter for antineoplastic chemotherapy: Secondary | ICD-10-CM | POA: Diagnosis not present

## 2016-06-06 DIAGNOSIS — C50211 Malignant neoplasm of upper-inner quadrant of right female breast: Secondary | ICD-10-CM | POA: Diagnosis not present

## 2016-06-06 LAB — COMPREHENSIVE METABOLIC PANEL
ALT: 21 U/L (ref 0–55)
AST: 17 U/L (ref 5–34)
Albumin: 4 g/dL (ref 3.5–5.0)
Alkaline Phosphatase: 70 U/L (ref 40–150)
Anion Gap: 8 mEq/L (ref 3–11)
BILIRUBIN TOTAL: 0.56 mg/dL (ref 0.20–1.20)
BUN: 8.4 mg/dL (ref 7.0–26.0)
CO2: 27 meq/L (ref 22–29)
CREATININE: 0.7 mg/dL (ref 0.6–1.1)
Calcium: 9.6 mg/dL (ref 8.4–10.4)
Chloride: 103 mEq/L (ref 98–109)
EGFR: >90 mL/min/{1.73_m2} (ref >90)
GLUCOSE: 122 mg/dL (ref 70–140)
Potassium: 4.7 mEq/L (ref 3.5–5.1)
SODIUM: 138 meq/L (ref 136–145)
TOTAL PROTEIN: 7.5 g/dL (ref 6.4–8.3)

## 2016-06-06 LAB — CBC WITH DIFFERENTIAL/PLATELET
BASO%: 0.9 % (ref 0.0–2.0)
BASOS ABS: 0 10*3/uL (ref 0.0–0.1)
EOS%: 2.1 % (ref 0.0–7.0)
Eosinophils Absolute: 0.1 10*3/uL (ref 0.0–0.5)
HEMATOCRIT: 33.3 % — AB (ref 34.8–46.6)
HGB: 11.4 g/dL — ABNORMAL LOW (ref 11.6–15.9)
LYMPH#: 0.7 10*3/uL — AB (ref 0.9–3.3)
LYMPH%: 21.5 % (ref 14.0–49.7)
MCH: 33.4 pg (ref 25.1–34.0)
MCHC: 34.2 g/dL (ref 31.5–36.0)
MCV: 97.7 fL (ref 79.5–101.0)
MONO#: 0.4 10*3/uL (ref 0.1–0.9)
MONO%: 10.9 % (ref 0.0–14.0)
NEUT#: 2.1 10*3/uL (ref 1.5–6.5)
NEUT%: 64.6 % (ref 38.4–76.8)
Platelets: 251 10*3/uL (ref 145–400)
RBC: 3.41 10*6/uL — ABNORMAL LOW (ref 3.70–5.45)
RDW: 13.1 % (ref 11.2–14.5)
WBC: 3.3 10*3/uL — ABNORMAL LOW (ref 3.9–10.3)

## 2016-06-06 MED ORDER — PACLITAXEL PROTEIN-BOUND CHEMO INJECTION 100 MG
80.0000 mg/m2 | Freq: Once | INTRAVENOUS | Status: AC
  Administered 2016-06-06: 150 mg via INTRAVENOUS
  Filled 2016-06-06: qty 30

## 2016-06-06 MED ORDER — PROCHLORPERAZINE MALEATE 10 MG PO TABS
ORAL_TABLET | ORAL | Status: AC
  Filled 2016-06-06: qty 1

## 2016-06-06 MED ORDER — SODIUM CHLORIDE 0.9 % IV SOLN
Freq: Once | INTRAVENOUS | Status: AC
  Administered 2016-06-06: 10:00:00 via INTRAVENOUS

## 2016-06-06 MED ORDER — HEPARIN SOD (PORK) LOCK FLUSH 100 UNIT/ML IV SOLN
500.0000 [IU] | Freq: Once | INTRAVENOUS | Status: AC | PRN
  Administered 2016-06-06: 500 [IU]
  Filled 2016-06-06: qty 5

## 2016-06-06 MED ORDER — PROCHLORPERAZINE MALEATE 10 MG PO TABS
10.0000 mg | ORAL_TABLET | Freq: Once | ORAL | Status: AC
  Administered 2016-06-06: 10 mg via ORAL

## 2016-06-06 MED ORDER — SODIUM CHLORIDE 0.9% FLUSH
10.0000 mL | INTRAVENOUS | Status: DC | PRN
  Administered 2016-06-06: 10 mL
  Filled 2016-06-06: qty 10

## 2016-06-06 NOTE — Patient Instructions (Signed)
[facility] Discharge Instructions for Patients Receiving Chemotherapy  Today you received the following chemotherapy agents abraxane  To help prevent nausea and vomiting after your treatment, we encourage you to take your nausea medication as directed. If you develop nausea and vomiting that is not controlled by your nausea medication, call the clinic.   BELOW ARE SYMPTOMS THAT SHOULD BE REPORTED IMMEDIATELY:  *FEVER GREATER THAN 100.5 F  *CHILLS WITH OR WITHOUT FEVER  NAUSEA AND VOMITING THAT IS NOT CONTROLLED WITH YOUR NAUSEA MEDICATION  *UNUSUAL SHORTNESS OF BREATH  *UNUSUAL BRUISING OR BLEEDING  TENDERNESS IN MOUTH AND THROAT WITH OR WITHOUT PRESENCE OF ULCERS  *URINARY PROBLEMS  *BOWEL PROBLEMS  UNUSUAL RASH Items with * indicate a potential emergency and should be followed up as soon as possible.  Feel free to call the clinic you have any questions or concerns. The clinic phone number is [phone].  Please show the [place] at check-in to the Emergency Department and triage nurse.

## 2016-06-13 ENCOUNTER — Other Ambulatory Visit (HOSPITAL_BASED_OUTPATIENT_CLINIC_OR_DEPARTMENT_OTHER): Payer: Managed Care, Other (non HMO)

## 2016-06-13 ENCOUNTER — Ambulatory Visit (HOSPITAL_BASED_OUTPATIENT_CLINIC_OR_DEPARTMENT_OTHER): Payer: Managed Care, Other (non HMO)

## 2016-06-13 ENCOUNTER — Encounter: Payer: Self-pay | Admitting: Hematology and Oncology

## 2016-06-13 ENCOUNTER — Ambulatory Visit (HOSPITAL_BASED_OUTPATIENT_CLINIC_OR_DEPARTMENT_OTHER): Payer: Managed Care, Other (non HMO) | Admitting: Hematology and Oncology

## 2016-06-13 DIAGNOSIS — D6481 Anemia due to antineoplastic chemotherapy: Secondary | ICD-10-CM

## 2016-06-13 DIAGNOSIS — L658 Other specified nonscarring hair loss: Secondary | ICD-10-CM

## 2016-06-13 DIAGNOSIS — C50211 Malignant neoplasm of upper-inner quadrant of right female breast: Secondary | ICD-10-CM

## 2016-06-13 DIAGNOSIS — Z5111 Encounter for antineoplastic chemotherapy: Secondary | ICD-10-CM

## 2016-06-13 LAB — CBC WITH DIFFERENTIAL/PLATELET
BASO%: 0.7 % (ref 0.0–2.0)
Basophils Absolute: 0 10*3/uL (ref 0.0–0.1)
EOS ABS: 0.1 10*3/uL (ref 0.0–0.5)
EOS%: 2.4 % (ref 0.0–7.0)
HEMATOCRIT: 34.6 % — AB (ref 34.8–46.6)
HGB: 12 g/dL (ref 11.6–15.9)
LYMPH#: 0.9 10*3/uL (ref 0.9–3.3)
LYMPH%: 21.9 % (ref 14.0–49.7)
MCH: 33.5 pg (ref 25.1–34.0)
MCHC: 34.7 g/dL (ref 31.5–36.0)
MCV: 96.6 fL (ref 79.5–101.0)
MONO#: 0.3 10*3/uL (ref 0.1–0.9)
MONO%: 7.4 % (ref 0.0–14.0)
NEUT#: 2.8 10*3/uL (ref 1.5–6.5)
NEUT%: 67.6 % (ref 38.4–76.8)
PLATELETS: 239 10*3/uL (ref 145–400)
RBC: 3.58 10*6/uL — AB (ref 3.70–5.45)
RDW: 12.7 % (ref 11.2–14.5)
WBC: 4.2 10*3/uL (ref 3.9–10.3)

## 2016-06-13 LAB — COMPREHENSIVE METABOLIC PANEL
ALT: 17 U/L (ref 0–55)
AST: 17 U/L (ref 5–34)
Albumin: 4 g/dL (ref 3.5–5.0)
Alkaline Phosphatase: 65 U/L (ref 40–150)
Anion Gap: 10 mEq/L (ref 3–11)
BUN: 6.7 mg/dL — AB (ref 7.0–26.0)
CHLORIDE: 102 meq/L (ref 98–109)
CO2: 28 meq/L (ref 22–29)
Calcium: 9.7 mg/dL (ref 8.4–10.4)
Creatinine: 0.7 mg/dL (ref 0.6–1.1)
Glucose: 99 mg/dl (ref 70–140)
POTASSIUM: 4.3 meq/L (ref 3.5–5.1)
SODIUM: 140 meq/L (ref 136–145)
Total Bilirubin: 0.58 mg/dL (ref 0.20–1.20)
Total Protein: 7.5 g/dL (ref 6.4–8.3)

## 2016-06-13 MED ORDER — SODIUM CHLORIDE 0.9 % IV SOLN
Freq: Once | INTRAVENOUS | Status: AC
  Administered 2016-06-13: 10:00:00 via INTRAVENOUS

## 2016-06-13 MED ORDER — PROCHLORPERAZINE MALEATE 10 MG PO TABS
10.0000 mg | ORAL_TABLET | Freq: Once | ORAL | Status: AC
  Administered 2016-06-13: 10 mg via ORAL

## 2016-06-13 MED ORDER — PACLITAXEL PROTEIN-BOUND CHEMO INJECTION 100 MG
80.0000 mg/m2 | Freq: Once | INTRAVENOUS | Status: AC
  Administered 2016-06-13: 150 mg via INTRAVENOUS
  Filled 2016-06-13: qty 30

## 2016-06-13 MED ORDER — HEPARIN SOD (PORK) LOCK FLUSH 100 UNIT/ML IV SOLN
500.0000 [IU] | Freq: Once | INTRAVENOUS | Status: AC | PRN
  Administered 2016-06-13: 500 [IU]
  Filled 2016-06-13: qty 5

## 2016-06-13 MED ORDER — SODIUM CHLORIDE 0.9% FLUSH
10.0000 mL | INTRAVENOUS | Status: DC | PRN
  Administered 2016-06-13: 10 mL
  Filled 2016-06-13: qty 10

## 2016-06-13 MED ORDER — PROCHLORPERAZINE MALEATE 10 MG PO TABS
ORAL_TABLET | ORAL | Status: AC
  Filled 2016-06-13: qty 1

## 2016-06-13 NOTE — Patient Instructions (Signed)
[facility] Discharge Instructions for Patients Receiving Chemotherapy  Today you received the following chemotherapy agents abraxane  To help prevent nausea and vomiting after your treatment, we encourage you to take your nausea medication as directed. If you develop nausea and vomiting that is not controlled by your nausea medication, call the clinic.   BELOW ARE SYMPTOMS THAT SHOULD BE REPORTED IMMEDIATELY:  *FEVER GREATER THAN 100.5 F  *CHILLS WITH OR WITHOUT FEVER  NAUSEA AND VOMITING THAT IS NOT CONTROLLED WITH YOUR NAUSEA MEDICATION  *UNUSUAL SHORTNESS OF BREATH  *UNUSUAL BRUISING OR BLEEDING  TENDERNESS IN MOUTH AND THROAT WITH OR WITHOUT PRESENCE OF ULCERS  *URINARY PROBLEMS  *BOWEL PROBLEMS  UNUSUAL RASH Items with * indicate a potential emergency and should be followed up as soon as possible.  Feel free to call the clinic you have any questions or concerns. The clinic phone number is [phone].  Please show the [place] at check-in to the Emergency Department and triage nurse.

## 2016-06-13 NOTE — Assessment & Plan Note (Signed)
Right breast biopsy 01/20/2016 12:00: Invasive ductal carcinoma, grade 3, lymphovascular invasion is present, ER 0%, PR 0%, Ki-67 95%, HER-2 negative ratio 1.19; mamm and [place] irregular mass 2.9 x 1.9 x 1.6 cm, no axillary LN T2 N0 stage IIA clinical stage  Treatment planbased on multidisciplinary tumor board: 1. Neoadjuvant chemotherapy with Adriamycin and Cytoxan dose dense 4 followed by Abraxane weekly 12 2. Followed by breast conserving surgery with sentinel lymph node study vs bilateral mastectomies. (patient appears to be very worried that the mammograms did not pick up her breast cancer with mammograms may not be sufficient) 3. Followed by adjuvant radiation therapy 4. Genetic counseling and testing on 03/26/2016 -------------------------------------------------------------------------------------- Current treatment: Completed 4 cycles of dose dense Adriamycin and Cytoxan, today is cycle 10/12 Abraxane  Chemotherapy toxicities: 1. Chemotherapy-induced Grade 2 anemia: hemoglobin 9.2, We are monitoring closely,awaiting today's labs 2. Bone pain related to Neulasta: No longer a problem 3. Alopecia  Denies any nausea vomiting, fevers or chills.  Return to clinic in 2weeks withcycle 12Abraxane.  Patient has recently moved to [facility] and takes about an hour and 15 minutes for her to get to her treatments. We discussed about the plan after she completes with chemotherapy. She will need breast MRI followed by tumor board presentation and surgery evaluation.   She is still debating whether she wants to do bilateral mastectomy or breast conserving surgery. Patient has a vacation planned to [place] and she may have to cancel her trip if she undergoes bilateral mastectomies. Her concern is related to the difficulty with identifying the cancer.  I will order breast MRI and present her in tumor board

## 2016-06-13 NOTE — Progress Notes (Signed)
Patient Care Team: Vernie Shanks, MD as PCP - General (Family Medicine)  SUMMARY OF ONCOLOGIC HISTORY:   Breast cancer of upper-inner quadrant of right female breast ([place])   01/20/2016 Initial Diagnosis    Right breast biopsy 12:00: Invasive ductal carcinoma, grade 3, lymphovascular invasion is present, ER 0%, PR 0%, Ki-67 95%, HER-2 negative ratio 1.19; mamm and [place] irregular mass 2.9 x 1.9 x 1.6 cm, no axillary LN T2 N0 stage IIA clinical stage      02/16/2016 -  Neo-Adjuvant Chemotherapy    Dose dense Adriamycin and Cytoxan 4 followed by Abraxane weekly 12       CHIEF COMPLIANT:  Cycle 10 Abraxane  INTERVAL HISTORY: Elizabeth Cunningham is a  20-yea-old with above-mentioned history of right breast cancer currently on neoadjuvant chemotherapy for triple negative disease today I cycle 10 of Abraxane. She is tolerating treatment fairly well. She has grade 1 neuropathy. Denies any nausea vomitng.  REVIEW OF SYSTEMS:   Constitutional: Denies fevers, chills or abnormal weight loss Eyes: Denies blurriness of vision Ears, nose, mouth, throat, and face: Denies mucositis or sore throat Respiratory: Denies cough, dyspnea or wheezes Cardiovascular: Denies palpitation, chest discomfort Gastrointestinal:  Denies nausea, heartburn or change in bowel habits Skin: Denies abnormal skin rashes Lymphatics: Denies new lymphadenopathy or easy bruising Neurological:mild euopathy Behavioral/Psych: Mood is stable, no new changes  Extremities: No lower extremity edema Breast:  denies any pain or lumps or nodules in either breasts All other systems were reviewed with the patient and are negative.  I have reviewed the past medical history, past surgical history, social history and family history with the patient and they are unchanged from previous note.  ALLERGIES:  has No Known Allergies.  MEDICATIONS:  Current Outpatient Prescriptions  Medication Sig Dispense Refill  . atenolol (TENORMIN) 25 MG  tablet Take 25 mg by mouth daily.    . Coenzyme Q10 (CO Q 10 PO) Take 1 capsule by mouth daily. Reported on 02/08/2016    . lidocaine-prilocaine (EMLA) cream Apply to affected area once 30 g 3  . LORazepam (ATIVAN) 0.5 MG tablet Take 1 tablet (0.5 mg total) by mouth at bedtime. (Patient not taking: Reported on 02/08/2016) 30 tablet 0  . Multiple Vitamins-Minerals (MULTIVITAMIN WITH MINERALS) tablet Take 1 tablet by mouth daily.    . Omega 3 1000 MG CAPS Take 2 capsules by mouth daily.    . ondansetron (ZOFRAN) 8 MG tablet Take 1 tablet (8 mg total) by mouth 2 (two) times daily as needed. Start on the third day after chemotherapy. (Patient not taking: Reported on 02/08/2016) 30 tablet 1  . prochlorperazine (COMPAZINE) 10 MG tablet Take 1 tablet (10 mg total) by mouth every 6 (six) hours as needed (Nausea or vomiting). (Patient not taking: Reported on 02/08/2016) 30 tablet 1   No current facility-administered medications for this visit.     PHYSICAL EXAMINATION: ECOG PERFORMANCE STATUS: 1 - Symptomatic but completely ambulatory  Vitals:   06/13/16 0844  BP: 126/79  Pulse: 71  Resp: 18  Temp: 98 F (36.7 C)   Filed Weights   06/13/16 0844  Weight: 143 lb 12.8 oz (65.2 kg)    GENERAL:alert, no distress and comfortable SKIN: skin color, texture, turgor are normal, no rashes or significant lesions EYES: normal, Conjunctiva are pink and non-injected, sclera clear OROPHARYNX:no exudate, no erythema and lips, buccal mucosa, and tongue normal  NECK: supple, thyroid normal size, non-tender, without nodularity LYMPH:  no palpable lymphadenopathy in the  cervical, axillary or inguinal LUNGS: clear to auscultation and percussion with normal breathing effort HEART: regular rate & rhythm and no murmurs and no lower extremity edema ABDOMEN:abdomen soft, non-tender and normal bowel sounds MUSCULOSKELETAL:no cyanosis of digits and no clubbing  NEURO: alert & oriented x 3 with fluent speech, no focal  motor/sensory deficits EXTREMITIES: No lower extremity edema BREAST: No palpable masses or nodules in either right or left breasts. No palpable axillary supraclavicular or infraclavicular adenopathy no breast tenderness or nipple discharge. (exam performed in the presence of a chaperone)  LABORATORY DATA:  I have reviewed the data as listed   Chemistry      Component Value Date/Time   NA 138 06/06/2016 0934   K 4.7 06/06/2016 0934   CL 102 02/03/2016 1129   CO2 27 06/06/2016 0934   BUN 8.4 06/06/2016 0934   CREATININE 0.7 06/06/2016 0934      Component Value Date/Time   CALCIUM 9.6 06/06/2016 0934   ALKPHOS 70 06/06/2016 0934   AST 17 06/06/2016 0934   ALT 21 06/06/2016 0934   BILITOT 0.56 06/06/2016 0934       Lab Results  Component Value Date   WBC 4.2 06/13/2016   HGB 12.0 06/13/2016   HCT 34.6 (L) 06/13/2016   MCV 96.6 06/13/2016   PLT 239 06/13/2016   NEUTROABS 2.8 06/13/2016     ASSESSMENT & PLAN:  Breast cancer of upper-inner quadrant of right female breast ([place]) Right breast biopsy 01/20/2016 12:00: Invasive ductal carcinoma, grade 3, lymphovascular invasion is present, ER 0%, PR 0%, Ki-67 95%, HER-2 negative ratio 1.19; mamm and [place] irregular mass 2.9 x 1.9 x 1.6 cm, no axillary LN T2 N0 stage IIA clinical stage  Treatment planbased on multidisciplinary tumor board: 1. Neoadjuvant chemotherapy with Adriamycin and Cytoxan dose dense 4 followed by Abraxane weekly 12 2. Followed by breast conserving surgery with sentinel lymph node study vs bilateral mastectomies. (patient appears to be very worried that the mammograms did not pick up her breast cancer with mammograms may not be sufficient) 3. Followed by adjuvant radiation therapy 4. Genetic counseling and testing on 03/26/2016 -------------------------------------------------------------------------------------- Current treatment: Completed 4 cycles of dose dense Adriamycin and Cytoxan, today is cycle 10/12  Abraxane  Chemotherapy toxicities: 1. Chemotherapy-induced Grade 2 anemia: hemoglobin 9.2, We are monitoring closely,awaiting today's labs 2. Bone pain related to Neulasta: No longer a problem 3. Alopecia  Denies any nausea vomiting, fevers or chills.  Return to clinic in 2weeks withcycle 12Abraxane.  Patient has recently moved to [facility] and takes about an hour and 15 minutes for her to get to her treatments. We discussed about the plan after she completes with chemotherapy. She will need breast MRI followed by tumor board presentation and surgery evaluation.   She is still debating whether she wants to do bilateral mastectomy or breast conserving surgery. Patient has a vacation planned to [place] and she may have to cancel her trip if she undergoes bilateral mastectomies. Her concern is related to the difficulty with identifying the cancer.  I will order breast MRI and present her in tumor board   No orders of the defined types were placed in this encounter.  The patient has a good understanding of the overall plan. she agrees with it. she will call with any problems that may develop before the next visit here.   Rulon Eisenmenger, MD 06/13/16

## 2016-06-20 ENCOUNTER — Other Ambulatory Visit (HOSPITAL_BASED_OUTPATIENT_CLINIC_OR_DEPARTMENT_OTHER): Payer: Managed Care, Other (non HMO)

## 2016-06-20 ENCOUNTER — Ambulatory Visit (HOSPITAL_BASED_OUTPATIENT_CLINIC_OR_DEPARTMENT_OTHER): Payer: Managed Care, Other (non HMO)

## 2016-06-20 VITALS — BP 153/92 | HR 74 | Temp 97.6°F | Resp 18

## 2016-06-20 DIAGNOSIS — Z5111 Encounter for antineoplastic chemotherapy: Secondary | ICD-10-CM | POA: Diagnosis not present

## 2016-06-20 DIAGNOSIS — C50211 Malignant neoplasm of upper-inner quadrant of right female breast: Secondary | ICD-10-CM

## 2016-06-20 LAB — COMPREHENSIVE METABOLIC PANEL
ALK PHOS: 74 U/L (ref 40–150)
ALT: 20 U/L (ref 0–55)
ANION GAP: 9 meq/L (ref 3–11)
AST: 18 U/L (ref 5–34)
Albumin: 3.9 g/dL (ref 3.5–5.0)
BILIRUBIN TOTAL: 0.45 mg/dL (ref 0.20–1.20)
BUN: 6.3 mg/dL — ABNORMAL LOW (ref 7.0–26.0)
CALCIUM: 9.4 mg/dL (ref 8.4–10.4)
CO2: 27 meq/L (ref 22–29)
CREATININE: 0.7 mg/dL (ref 0.6–1.1)
Chloride: 102 mEq/L (ref 98–109)
Glucose: 108 mg/dl (ref 70–140)
Potassium: 4.2 mEq/L (ref 3.5–5.1)
SODIUM: 138 meq/L (ref 136–145)
TOTAL PROTEIN: 7.4 g/dL (ref 6.4–8.3)

## 2016-06-20 LAB — CBC WITH DIFFERENTIAL/PLATELET
BASO%: 1 % (ref 0.0–2.0)
BASOS ABS: 0.1 10*3/uL (ref 0.0–0.1)
EOS%: 2.3 % (ref 0.0–7.0)
Eosinophils Absolute: 0.1 10*3/uL (ref 0.0–0.5)
HEMATOCRIT: 34.1 % — AB (ref 34.8–46.6)
HEMOGLOBIN: 11.8 g/dL (ref 11.6–15.9)
LYMPH#: 1 10*3/uL (ref 0.9–3.3)
LYMPH%: 19.3 % (ref 14.0–49.7)
MCH: 33.4 pg (ref 25.1–34.0)
MCHC: 34.6 g/dL (ref 31.5–36.0)
MCV: 96.6 fL (ref 79.5–101.0)
MONO#: 0.5 10*3/uL (ref 0.1–0.9)
MONO%: 8.7 % (ref 0.0–14.0)
NEUT%: 68.7 % (ref 38.4–76.8)
NEUTROS ABS: 3.6 10*3/uL (ref 1.5–6.5)
Platelets: 266 10*3/uL (ref 145–400)
RBC: 3.53 10*6/uL — ABNORMAL LOW (ref 3.70–5.45)
RDW: 12.9 % (ref 11.2–14.5)
WBC: 5.2 10*3/uL (ref 3.9–10.3)

## 2016-06-20 MED ORDER — PROCHLORPERAZINE MALEATE 10 MG PO TABS
10.0000 mg | ORAL_TABLET | Freq: Once | ORAL | Status: AC
  Administered 2016-06-20: 10 mg via ORAL

## 2016-06-20 MED ORDER — PROCHLORPERAZINE MALEATE 10 MG PO TABS
ORAL_TABLET | ORAL | Status: AC
  Filled 2016-06-20: qty 1

## 2016-06-20 MED ORDER — HEPARIN SOD (PORK) LOCK FLUSH 100 UNIT/ML IV SOLN
500.0000 [IU] | Freq: Once | INTRAVENOUS | Status: AC | PRN
  Administered 2016-06-20: 500 [IU]
  Filled 2016-06-20: qty 5

## 2016-06-20 MED ORDER — SODIUM CHLORIDE 0.9% FLUSH
10.0000 mL | INTRAVENOUS | Status: DC | PRN
  Administered 2016-06-20: 10 mL
  Filled 2016-06-20: qty 10

## 2016-06-20 MED ORDER — SODIUM CHLORIDE 0.9 % IV SOLN
Freq: Once | INTRAVENOUS | Status: AC
  Administered 2016-06-20: 10:00:00 via INTRAVENOUS

## 2016-06-20 MED ORDER — PACLITAXEL PROTEIN-BOUND CHEMO INJECTION 100 MG
80.0000 mg/m2 | Freq: Once | INTRAVENOUS | Status: AC
  Administered 2016-06-20: 150 mg via INTRAVENOUS
  Filled 2016-06-20: qty 30

## 2016-06-20 NOTE — Patient Instructions (Signed)
[facility] Discharge Instructions for Patients Receiving Chemotherapy  Today you received the following chemotherapy agents; Abraxene.   To help prevent nausea and vomiting after your treatment, we encourage you to take your nausea medication as directed.    If you develop nausea and vomiting that is not controlled by your nausea medication, call the clinic.   BELOW ARE SYMPTOMS THAT SHOULD BE REPORTED IMMEDIATELY:  *FEVER GREATER THAN 100.5 F  *CHILLS WITH OR WITHOUT FEVER  NAUSEA AND VOMITING THAT IS NOT CONTROLLED WITH YOUR NAUSEA MEDICATION  *UNUSUAL SHORTNESS OF BREATH  *UNUSUAL BRUISING OR BLEEDING  TENDERNESS IN MOUTH AND THROAT WITH OR WITHOUT PRESENCE OF ULCERS  *URINARY PROBLEMS  *BOWEL PROBLEMS  UNUSUAL RASH Items with * indicate a potential emergency and should be followed up as soon as possible.  Feel free to call the clinic you have any questions or concerns. The clinic phone number is [phone].  Please show the CHEMO ALERT CARD at check-in to the Emergency Department and triage nurse.   

## 2016-06-22 ENCOUNTER — Other Ambulatory Visit: Payer: Managed Care, Other (non HMO)

## 2016-06-27 ENCOUNTER — Encounter: Payer: Self-pay | Admitting: Hematology and Oncology

## 2016-06-27 ENCOUNTER — Ambulatory Visit (HOSPITAL_BASED_OUTPATIENT_CLINIC_OR_DEPARTMENT_OTHER): Payer: Managed Care, Other (non HMO) | Admitting: Hematology and Oncology

## 2016-06-27 ENCOUNTER — Other Ambulatory Visit (HOSPITAL_BASED_OUTPATIENT_CLINIC_OR_DEPARTMENT_OTHER): Payer: Managed Care, Other (non HMO)

## 2016-06-27 ENCOUNTER — Ambulatory Visit (HOSPITAL_BASED_OUTPATIENT_CLINIC_OR_DEPARTMENT_OTHER): Payer: Managed Care, Other (non HMO)

## 2016-06-27 DIAGNOSIS — M899 Disorder of bone, unspecified: Secondary | ICD-10-CM

## 2016-06-27 DIAGNOSIS — Z5111 Encounter for antineoplastic chemotherapy: Secondary | ICD-10-CM | POA: Diagnosis not present

## 2016-06-27 DIAGNOSIS — D6481 Anemia due to antineoplastic chemotherapy: Secondary | ICD-10-CM | POA: Diagnosis not present

## 2016-06-27 DIAGNOSIS — C50211 Malignant neoplasm of upper-inner quadrant of right female breast: Secondary | ICD-10-CM

## 2016-06-27 DIAGNOSIS — L658 Other specified nonscarring hair loss: Secondary | ICD-10-CM | POA: Diagnosis not present

## 2016-06-27 LAB — CBC WITH DIFFERENTIAL/PLATELET
BASO%: 1.8 % (ref 0.0–2.0)
Basophils Absolute: 0.1 10*3/uL (ref 0.0–0.1)
EOS ABS: 0.1 10*3/uL (ref 0.0–0.5)
EOS%: 3.9 % (ref 0.0–7.0)
HCT: 35.5 % (ref 34.8–46.6)
HEMOGLOBIN: 12 g/dL (ref 11.6–15.9)
LYMPH%: 30.1 % (ref 14.0–49.7)
MCH: 32.5 pg (ref 25.1–34.0)
MCHC: 33.8 g/dL (ref 31.5–36.0)
MCV: 96.2 fL (ref 79.5–101.0)
MONO#: 0.3 10*3/uL (ref 0.1–0.9)
MONO%: 8.4 % (ref 0.0–14.0)
NEUT%: 55.8 % (ref 38.4–76.8)
NEUTROS ABS: 1.9 10*3/uL (ref 1.5–6.5)
Platelets: 238 10*3/uL (ref 145–400)
RBC: 3.69 10*6/uL — ABNORMAL LOW (ref 3.70–5.45)
RDW: 12.7 % (ref 11.2–14.5)
WBC: 3.3 10*3/uL — AB (ref 3.9–10.3)
lymph#: 1 10*3/uL (ref 0.9–3.3)

## 2016-06-27 LAB — COMPREHENSIVE METABOLIC PANEL
ALT: 18 U/L (ref 0–55)
ANION GAP: 11 meq/L (ref 3–11)
AST: 16 U/L (ref 5–34)
Albumin: 3.9 g/dL (ref 3.5–5.0)
Alkaline Phosphatase: 63 U/L (ref 40–150)
BILIRUBIN TOTAL: 0.49 mg/dL (ref 0.20–1.20)
BUN: 8.2 mg/dL (ref 7.0–26.0)
CHLORIDE: 103 meq/L (ref 98–109)
CO2: 25 meq/L (ref 22–29)
CREATININE: 0.7 mg/dL (ref 0.6–1.1)
Calcium: 9.5 mg/dL (ref 8.4–10.4)
EGFR: >90 mL/min/{1.73_m2} (ref >90)
GLUCOSE: 105 mg/dL (ref 70–140)
Potassium: 4.3 mEq/L (ref 3.5–5.1)
SODIUM: 139 meq/L (ref 136–145)
TOTAL PROTEIN: 7.2 g/dL (ref 6.4–8.3)

## 2016-06-27 MED ORDER — HEPARIN SOD (PORK) LOCK FLUSH 100 UNIT/ML IV SOLN
500.0000 [IU] | Freq: Once | INTRAVENOUS | Status: AC | PRN
  Administered 2016-06-27: 500 [IU]
  Filled 2016-06-27: qty 5

## 2016-06-27 MED ORDER — PACLITAXEL PROTEIN-BOUND CHEMO INJECTION 100 MG
80.0000 mg/m2 | Freq: Once | INTRAVENOUS | Status: AC
  Administered 2016-06-27: 150 mg via INTRAVENOUS
  Filled 2016-06-27: qty 30

## 2016-06-27 MED ORDER — PROCHLORPERAZINE MALEATE 10 MG PO TABS
ORAL_TABLET | ORAL | Status: AC
  Filled 2016-06-27: qty 1

## 2016-06-27 MED ORDER — PROCHLORPERAZINE MALEATE 10 MG PO TABS
10.0000 mg | ORAL_TABLET | Freq: Once | ORAL | Status: AC
  Administered 2016-06-27: 10 mg via ORAL

## 2016-06-27 MED ORDER — SODIUM CHLORIDE 0.9% FLUSH
10.0000 mL | INTRAVENOUS | Status: DC | PRN
  Administered 2016-06-27: 10 mL
  Filled 2016-06-27: qty 10

## 2016-06-27 MED ORDER — SODIUM CHLORIDE 0.9 % IV SOLN
Freq: Once | INTRAVENOUS | Status: AC
  Administered 2016-06-27: 10:00:00 via INTRAVENOUS

## 2016-06-27 NOTE — Progress Notes (Signed)
Patient Care Team: Vernie Shanks, MD as PCP - General (Family Medicine)  SUMMARY OF ONCOLOGIC HISTORY:   Breast cancer of upper-inner quadrant of right female breast ([place])   01/20/2016 Initial Diagnosis    Right breast biopsy 12:00: Invasive ductal carcinoma, grade 3, lymphovascular invasion is present, ER 0%, PR 0%, Ki-67 95%, HER-2 negative ratio 1.19; mamm and [place] irregular mass 2.9 x 1.9 x 1.6 cm, no axillary LN T2 N0 stage IIA clinical stage      02/16/2016 -  Neo-Adjuvant Chemotherapy    Dose dense Adriamycin and Cytoxan 4 followed by Abraxane weekly 12       CHIEF COMPLIANT: cycle 12 Abraxane  INTERVAL HISTORY: Elizabeth Cunningham is a 50 year old with above-mentioned history of right breast cancer currently on neoadjuvant chemotherapy and today is cycle 12 Abraxane and is the last cycle of chemotherapy. She tolerated chemotherapy extremely well. She did not have any neuropathy. Denies any nausea vomiting. She has remained fairly active and did not have any fevers or chills.  REVIEW OF SYSTEMS:   Constitutional: Denies fevers, chills or abnormal weight loss Eyes: Denies blurriness of vision Ears, nose, mouth, throat, and face: Denies mucositis or sore throat Respiratory: Denies cough, dyspnea or wheezes Cardiovascular: Denies palpitation, chest discomfort Gastrointestinal:  Denies nausea, heartburn or change in bowel habits Skin: Denies abnormal skin rashes Lymphatics: Denies new lymphadenopathy or easy bruising Neurological:Denies numbness, tingling or new weaknesses Behavioral/Psych: Mood is stable, no new changes  Extremities: No lower extremity edema Breast:  denies any pain or lumps or nodules in either breasts All other systems were reviewed with the patient and are negative.  I have reviewed the past medical history, past surgical history, social history and family history with the patient and they are unchanged from previous note.  ALLERGIES:  has No Known  Allergies.  MEDICATIONS:  Current Outpatient Prescriptions  Medication Sig Dispense Refill  . atenolol (TENORMIN) 25 MG tablet Take 25 mg by mouth daily.    . Coenzyme Q10 (CO Q 10 PO) Take 1 capsule by mouth daily. Reported on 02/08/2016    . lidocaine-prilocaine (EMLA) cream Apply to affected area once 30 g 3  . LORazepam (ATIVAN) 0.5 MG tablet Take 1 tablet (0.5 mg total) by mouth at bedtime. (Patient not taking: Reported on 02/08/2016) 30 tablet 0  . Multiple Vitamins-Minerals (MULTIVITAMIN WITH MINERALS) tablet Take 1 tablet by mouth daily.    . Omega 3 1000 MG CAPS Take 2 capsules by mouth daily.    . ondansetron (ZOFRAN) 8 MG tablet Take 1 tablet (8 mg total) by mouth 2 (two) times daily as needed. Start on the third day after chemotherapy. (Patient not taking: Reported on 02/08/2016) 30 tablet 1  . prochlorperazine (COMPAZINE) 10 MG tablet Take 1 tablet (10 mg total) by mouth every 6 (six) hours as needed (Nausea or vomiting). (Patient not taking: Reported on 02/08/2016) 30 tablet 1   No current facility-administered medications for this visit.     PHYSICAL EXAMINATION: ECOG PERFORMANCE STATUS: 1 - Symptomatic but completely ambulatory  Vitals:   06/27/16 0910  BP: 139/80  Pulse: 67  Resp: 16  Temp: 97.8 F (36.6 C)   Filed Weights   06/27/16 0910  Weight: 143 lb 11.2 oz (65.2 kg)    GENERAL:alert, no distress and comfortable SKIN: skin color, texture, turgor are normal, no rashes or significant lesions EYES: normal, Conjunctiva are pink and non-injected, sclera clear OROPHARYNX:no exudate, no erythema and lips, buccal mucosa, and  tongue normal  NECK: supple, thyroid normal size, non-tender, without nodularity LYMPH:  no palpable lymphadenopathy in the cervical, axillary or inguinal LUNGS: clear to auscultation and percussion with normal breathing effort HEART: regular rate & rhythm and no murmurs and no lower extremity edema ABDOMEN:abdomen soft, non-tender and normal  bowel sounds MUSCULOSKELETAL:no cyanosis of digits and no clubbing  NEURO: alert & oriented x 3 with fluent speech, no focal motor/sensory deficits EXTREMITIES: No lower extremity edema   LABORATORY DATA:  I have reviewed the data as listed   Chemistry      Component Value Date/Time   NA 139 06/27/2016 0846   K 4.3 06/27/2016 0846   CL 102 02/03/2016 1129   CO2 25 06/27/2016 0846   BUN 8.2 06/27/2016 0846   CREATININE 0.7 06/27/2016 0846      Component Value Date/Time   CALCIUM 9.5 06/27/2016 0846   ALKPHOS 63 06/27/2016 0846   AST 16 06/27/2016 0846   ALT 18 06/27/2016 0846   BILITOT 0.49 06/27/2016 0846       Lab Results  Component Value Date   WBC 3.3 (L) 06/27/2016   HGB 12.0 06/27/2016   HCT 35.5 06/27/2016   MCV 96.2 06/27/2016   PLT 238 06/27/2016   NEUTROABS 1.9 06/27/2016     ASSESSMENT & PLAN:  Breast cancer of upper-inner quadrant of right female breast ([place]) Right breast biopsy 01/20/2016 12:00: Invasive ductal carcinoma, grade 3, lymphovascular invasion is present, ER 0%, PR 0%, Ki-67 95%, HER-2 negative ratio 1.19; mamm and [place] irregular mass 2.9 x 1.9 x 1.6 cm, no axillary LN T2 N0 stage IIA clinical stage  Treatment planbased on multidisciplinary tumor board: 1. Neoadjuvant chemotherapy with Adriamycin and Cytoxan dose dense 4 followed by Abraxane weekly 12 2. bilateral mastectomies. (patient appears to be very worried that the mammograms did not pick up her breast cancer with mammograms may not be sufficient) 3. Followed by adjuvant radiation therapy ( will be necessary only if she undergoes lumpectomy) 4. Genetic counseling and testing on 03/26/2016: Negative -------------------------------------------------------------------------------------- Current treatment: Completed 4 cycles of dose dense Adriamycin and Cytoxan, today is cycle 12/12 Abraxane  Chemotherapy toxicities: 1. Chemotherapy-induced Grade 2 anemia: hemoglobin 9.2, We are  monitoring closely,awaiting today's labs 2. Bone pain related to Neulasta: No longer a problem 3. Alopecia  Denies any nausea vomiting, fevers or chills.  Return to clinic after breast MRI to review the results. Patient has an appointment to see Dr. Brantley Stage on Monday.I would leave the appointment in the system for the time being. If she felt that she does not need to see me to review the MRI then we can cancel it. She will call and inform us after her Monday's appointment with Dr. Brantley Stage.   No orders of the defined types were placed in this encounter.  The patient has a good understanding of the overall plan. she agrees with it. she will call with any problems that may develop before the next visit here.   Rulon Eisenmenger, MD 06/27/16

## 2016-06-27 NOTE — Patient Instructions (Signed)
[facility] Discharge Instructions for Patients Receiving Chemotherapy  Today you received the following chemotherapy agents Abraxane To help prevent nausea and vomiting after your treatment, we encourage you to take your nausea medication as prescribed.   If you develop nausea and vomiting that is not controlled by your nausea medication, call the clinic.   BELOW ARE SYMPTOMS THAT SHOULD BE REPORTED IMMEDIATELY:  *FEVER GREATER THAN 100.5 F  *CHILLS WITH OR WITHOUT FEVER  NAUSEA AND VOMITING THAT IS NOT CONTROLLED WITH YOUR NAUSEA MEDICATION  *UNUSUAL SHORTNESS OF BREATH  *UNUSUAL BRUISING OR BLEEDING  TENDERNESS IN MOUTH AND THROAT WITH OR WITHOUT PRESENCE OF ULCERS  *URINARY PROBLEMS  *BOWEL PROBLEMS  UNUSUAL RASH Items with * indicate a potential emergency and should be followed up as soon as possible.  Feel free to call the clinic you have any questions or concerns. The clinic phone number is [phone].  Please show the CHEMO ALERT CARD at check-in to the Emergency Department and triage nurse.   

## 2016-06-27 NOTE — Assessment & Plan Note (Signed)
Right breast biopsy 01/20/2016 12:00: Invasive ductal carcinoma, grade 3, lymphovascular invasion is present, ER 0%, PR 0%, Ki-67 95%, HER-2 negative ratio 1.19; mamm and [place] irregular mass 2.9 x 1.9 x 1.6 cm, no axillary LN T2 N0 stage IIA clinical stage  Treatment planbased on multidisciplinary tumor board: 1. Neoadjuvant chemotherapy with Adriamycin and Cytoxan dose dense 4 followed by Abraxane weekly 12 2. Followed by breast conserving surgery with sentinel lymph node study vs bilateral mastectomies. (patient appears to be very worried that the mammograms did not pick up her breast cancer with mammograms may not be sufficient) 3. Followed by adjuvant radiation therapy 4. Genetic counseling and testing on 03/26/2016 -------------------------------------------------------------------------------------- Current treatment: Completed 4 cycles of dose dense Adriamycin and Cytoxan, today is cycle 12/12 Abraxane  Chemotherapy toxicities: 1. Chemotherapy-induced Grade 2 anemia: hemoglobin 9.2, We are monitoring closely,awaiting today's labs 2. Bone pain related to Neulasta: No longer a problem 3. Alopecia  Denies any nausea vomiting, fevers or chills.  Return to clinic after breast MRI to review the results.

## 2016-06-28 ENCOUNTER — Telehealth: Payer: Self-pay | Admitting: *Deleted

## 2016-06-28 NOTE — Telephone Encounter (Signed)
  Oncology Nurse Navigator Documentation  Navigator Location: [facility]Med Onc (06/28/16 1300) Navigator Encounter Type: Telephone (06/28/16 1300) Telephone: [place] Call (06/28/16 1300)         Patient Visit Type: MedOnc (06/28/16 1300) Treatment Phase: Final Chemo TX (06/27/16) (06/28/16 1300)                            Time Spent with Patient: 15 (06/28/16 1300)

## 2016-06-29 ENCOUNTER — Ambulatory Visit
Admission: RE | Admit: 2016-06-29 | Discharge: 2016-06-29 | Disposition: A | Discharge status: Home / Self Care | Payer: Managed Care, Other (non HMO) | Source: Ambulatory Visit | Attending: Hematology and Oncology | Admitting: Hematology and Oncology

## 2016-06-29 DIAGNOSIS — C50211 Malignant neoplasm of upper-inner quadrant of right female breast: Secondary | ICD-10-CM

## 2016-06-29 MED ORDER — GADOBENATE DIMEGLUMINE 529 MG/ML IV SOLN
15.0000 mL | Freq: Once | INTRAVENOUS | Status: AC | PRN
  Administered 2016-06-29: 13 mL via INTRAVENOUS

## 2016-07-02 ENCOUNTER — Ambulatory Visit: Payer: Self-pay | Admitting: Surgery

## 2016-07-02 NOTE — H&P (Signed)
Elizabeth Cunningham 07/02/2016 11:28 AM Location: [facility] Patient #: [id number] DOB: 1966/08/25 Married / Language: Elizabeth Cunningham / Race: White Female  History of Present Illness [name] A. Muhsin Doris MD; 07/02/2016 12:06 PM) Patient words: Patient returns for follow-up after neoadjuvant chemotherapy for stage II right breast cancer triple negative.  MRI was done which shows a complete response.  She has very dense tissue in her breast therefore making mammography less sensitive in her case. She initially wanted bilateral mastectomies given this fact but is here today to discuss surgery. She will keep her port for a study that she is enrolling into her breast cancer. She isn't very well chemotherapy with minimal side effects.                                01/20/2016 Initial Diagnosis Right breast biopsy 12:00: Invasive ductal carcinoma, grade 3, lymphovascular invasion is present, ER 0%, PR 0%, Ki-67 95%, HER-2 negative ratio 1.19; mamm and [place] irregular mass 2.9 x 1.9 x 1.6 cm, no axillary LN T2 N0 stage II    Elizabeth Cunningham is a 50 year old with above-mentioned history of right breast cancer currently on neoadjuvant chemotherapy and today is cycle 12 Abraxane and is the last cycle of chemotherapy. She tolerated chemotherapy extremely well. She did not have any neuropathy. Denies any nausea vomiting. She has remained fairly active and did not have any fevers or chills.  Right breast biopsy 01/20/2016 12:00: Invasive ductal carcinoma, grade 3, lymphovascular invasion is present, ER 0%, PR 0%, Ki-67 95%, HER-2 negative ratio 1.19; mamm and [place] irregular mass 2.9 x 1.9 x 1.6 cm, no axillary LN T2 N0 stage IIA clinical stage  Treatment plan based on multidisciplinary tumor board: 1. Neoadjuvant chemotherapy with Adriamycin and Cytoxan dose dense 4 followed by Abraxane weekly 12 2. bilateral mastectomies. (patient appears to be very worried that the  mammograms did not pick up her breast cancer with mammograms may not be sufficient) 3. Followed by adjuvant radiation therapy ( will be necessary only if she undergoes lumpectomy) 4. Genetic counseling and testing on 03/26/2016: Negative    CLINICAL DATA: Patient has history of right breast cancer, assess for neoadjuvant therapy response.  LABS: Does not apply  EXAM: BILATERAL BREAST MRI WITH AND WITHOUT CONTRAST  TECHNIQUE: Multiplanar, multisequence MR images of both breasts were obtained prior to and following the intravenous administration of 13 ml of MultiHance.  THREE-DIMENSIONAL MR IMAGE RENDERING ON INDEPENDENT WORKSTATION:  Three-dimensional MR images were rendered by post-processing of the original MR data on an independent workstation. The three-dimensional MR images were interpreted, and findings are reported in the following complete MRI report for this study. Three dimensional images were evaluated at the independent DynaCad workstation  COMPARISON: Previous exam(s).  FINDINGS: Breast composition: c. Heterogeneous fibroglandular tissue.  Background parenchymal enhancement: Mild.  Right breast: No mass or abnormal enhancement. The previously identified biopsy-proven enhancing neoplasm in the right breast is not seen today.  Left breast: No mass or abnormal enhancement.  Lymph nodes: No abnormal appearing lymph nodes.  Ancillary findings: None.  IMPRESSION: Known cancer.  RECOMMENDATION: Treatment plan.  BI-RADS CATEGORY 6: Known biopsy-proven malignancy.   Electronically Signed By: Abelardo Diesel M.D. On: 07/02/2016 10:53.  The patient is a 50 year old female.   Allergies Elbert Ewings, CMA; 07/02/2016 11:29 AM) No Known Drug Allergies 01/26/2016  Medication History Elbert Ewings, CMA; 07/02/2016 11:29 AM) Atenolol (25MG Tablet, Oral) Active. Multiple  Vitamin (Oral) Active. Omega 3 (1000MG Capsule, Oral) Active. Medications  Reconciled    Vitals Elbert Ewings CMA; 07/02/2016 11:29 AM) 07/02/2016 11:29 AM Weight: 143.4 lb Height: 65in Body Surface Area: 1.72 m Body Mass Index: 23.86 kg/m  Temp.: 98.44F(Temporal)  Pulse: 72 (Regular)  BP: 124/76 (Sitting, Left Arm, Standard)      Physical Exam (Folasade Mooty A. Gorge Almanza MD; 07/02/2016 12:06 PM)  General Note: Care loss noted  Head and Neck Head-normocephalic, atraumatic with no lesions or palpable masses. Trachea-midline. Thyroid Gland Characteristics - normal size and consistency.  Eye Eyeball - Bilateral-Extraocular movements intact. Sclera/Conjunctiva - Bilateral-No scleral icterus.  Chest and Lung Exam Chest and lung exam reveals -quiet, even and easy respiratory effort with no use of accessory muscles and on auscultation, normal breath sounds, no adventitious sounds and normal vocal resonance. Inspection Chest Wall - Normal. Back - normal.  Breast Note: Right breast is normal with no residual tumor palpated. Rule out insulinoma. Left breast normal. Left axilla normal.  Cardiovascular Cardiovascular examination reveals -normal heart sounds, regular rate and rhythm with no murmurs and normal pedal pulses bilaterally.  Musculoskeletal Normal Exam - Left-Upper Extremity Strength Normal and Lower Extremity Strength Normal. Normal Exam - Right-Upper Extremity Strength Normal and Lower Extremity Strength Normal.  Lymphatic Head & Neck  General Head & Neck Lymphatics: Bilateral - Description - Normal. Axillary  General Axillary Region: Bilateral - Description - Normal. Tenderness - Non Tender.    Assessment & Plan (Shirla Hodgkiss A. Oanh Devivo MD; 07/02/2016 12:08 PM)  BREAST CANCER, RIGHT (C50.911) Impression: Discussed treatment options with the patient to include breast conservation versus mastectomy with reconstruction.   Lengthy discussion today with patient and husband. Patient initially wanted bilateral  mastectomies and reconstruction. I talked with the pros and cons of this approach as well as breast conservation therapy and the pros and cons of this approach. I outlined recurrence rates for both as well as survival statistics for both. I outlined complications of both and long-term expectations and long-term expectations of both. I discussed the need for reconstruction and the patient is interested in doing that if she elects for bilateral mastectomy. I discussed nipple preservation in her case as well as no reconstruction if desired. She would like more time to think about her decision after today's meeting and we'll meet with the oncologist later on this week. She will call to set up surgery once she decides which option she would decide. She does have dense breast tissue making mammography less sensitive. She may be a candidate for biannual MRI given her dense breast tissue and history of breast cancer as well as failure MAMMOGRAPHIC diagnoses of her cancer. This does not necessitate bilateral mastectomies given complication rates are doubled in this circumstance.  Current Plans Pt Education - Pamphlet Given - Breast Biopsy: discussed with patient and provided information. We discussed the staging and pathophysiology of breast cancer. We discussed all of the different options for treatment for breast cancer including surgery, chemotherapy, radiation therapy, Herceptin, and antiestrogen therapy. We discussed a sentinel lymph node biopsy as she does not appear to having lymph node involvement right now. We discussed the performance of that with injection of radioactive tracer and blue dye. We discussed that she would have an incision underneath her axillary hairline. We discussed that there is a bout a 10-20% chance of having a positive node with a sentinel lymph node biopsy and we will await the permanent pathology to make any other first further decisions in terms of  her treatment. One of these options  might be to return to the operating room to perform an axillary lymph node dissection. We discussed about a 1-2% risk lifetime of chronic shoulder pain as well as lymphedema associated with a sentinel lymph node biopsy. We discussed the options for treatment of the breast cancer which included lumpectomy versus a mastectomy. We discussed the performance of the lumpectomy with a wire placement. We discussed a 10-20% chance of a positive margin requiring reexcision in the operating room. We also discussed that she may need radiation therapy or antiestrogen therapy or both if she undergoes lumpectomy. We discussed the mastectomy and the postoperative care for that as well. We discussed that there is no difference in her survival whether she undergoes lumpectomy with radiation therapy or antiestrogen therapy versus a mastectomy. There is a slight difference in the local recurrence rate being 3-5% with lumpectomy and about 1% with a mastectomy. We discussed the risks of operation including bleeding, infection, possible reoperation. She understands her further therapy will be based on what her stages at the time of her operation.  Pt Education - [facility] breast cancer surgery: discussed with patient and provided information. Pt Education - [facility] Mastectomy [facility]

## 2016-07-04 ENCOUNTER — Telehealth: Payer: Self-pay | Admitting: Hematology and Oncology

## 2016-07-04 ENCOUNTER — Ambulatory Visit (HOSPITAL_BASED_OUTPATIENT_CLINIC_OR_DEPARTMENT_OTHER): Payer: Managed Care, Other (non HMO) | Admitting: Hematology and Oncology

## 2016-07-04 DIAGNOSIS — C50211 Malignant neoplasm of upper-inner quadrant of right female breast: Secondary | ICD-10-CM | POA: Diagnosis not present

## 2016-07-04 DIAGNOSIS — Z171 Estrogen receptor negative status [ER-]: Secondary | ICD-10-CM

## 2016-07-04 NOTE — Telephone Encounter (Signed)
Faxed pt records to [phone]

## 2016-07-04 NOTE — Assessment & Plan Note (Signed)
Right breast biopsy 01/20/2016 12:00: Invasive ductal carcinoma, grade 3, lymphovascular invasion is present, ER 0%, PR 0%, Ki-67 95%, HER-2 negative ratio 1.19; mamm and [place] irregular mass 2.9 x 1.9 x 1.6 cm, no axillary LN T2 N0 stage IIA clinical stage  Treatment planbased on multidisciplinary tumor board: 1. Neoadjuvant chemotherapy with Adriamycin and Cytoxan dose dense 4 followed by Abraxane weekly 12 started 02/16/2016 completed 06/27/2016 2. Followed by bilateral mastectomies. (patient appears to be very worried that the mammograms did not pick up her breast cancer with mammograms may not be sufficient) 3. Genetic counseling and testing on 03/26/2016: Normal; no mutations identified -------------------------------------------------------------------------------------- Breast MRI 07/16/2016: Radiologic complete response  Patient was presented in the tumor board and based on her preference, she will undergo bilateral mastectomies with reconstruction. She may not need adjuvant radiation therapy if she does undergo bilateral mastectomies.  Return to clinic after surgery to discuss the results.

## 2016-07-04 NOTE — Progress Notes (Signed)
Patient Care Team: Vernie Shanks, MD as PCP - General (Family Medicine)  DIAGNOSIS:  Encounter Diagnosis  Name Primary?  . Malignant neoplasm of upper-inner quadrant of right breast in female, estrogen receptor negative ([place])     SUMMARY OF ONCOLOGIC HISTORY:   Breast cancer of upper-inner quadrant of right female breast ([place])   01/20/2016 Initial Diagnosis    Right breast biopsy 12:00: Invasive ductal carcinoma, grade 3, lymphovascular invasion is present, ER 0%, PR 0%, Ki-67 95%, HER-2 negative ratio 1.19; mamm and [place] irregular mass 2.9 x 1.9 x 1.6 cm, no axillary LN T2 N0 stage IIA clinical stage      02/16/2016 - 06/27/2016 Neo-Adjuvant Chemotherapy    Dose dense Adriamycin and Cytoxan 4 followed by Abraxane weekly 12      05/04/2016 Miscellaneous    Genetic testing was normal      06/29/2016 Breast MRI    Complete radiologic response       CHIEF COMPLIANT: F/U to review MRI  INTERVAL HISTORY: Elizabeth Cunningham is a 50 yr old with triple neg disease which had a recent Breast MRI and is here to make decisions for treatment.  REVIEW OF SYSTEMS:   Constitutional: Denies fevers, chills or abnormal weight loss Eyes: Denies blurriness of vision Ears, nose, mouth, throat, and face: Denies mucositis or sore throat Respiratory: Denies cough, dyspnea or wheezes Cardiovascular: Denies palpitation, chest discomfort Gastrointestinal:  Denies nausea, heartburn or change in bowel habits Skin: Denies abnormal skin rashes Lymphatics: Denies new lymphadenopathy or easy bruising Neurological:Denies numbness, tingling or new weaknesses Behavioral/Psych: Mood is stable, no new changes  Extremities: No lower extremity edema Breast:  denies any pain or lumps or nodules in either breasts All other systems were reviewed with the patient and are negative.  I have reviewed the past medical history, past surgical history, social history and family history with the patient and they are  unchanged from previous note.  ALLERGIES:  has No Known Allergies.  MEDICATIONS:  Current Outpatient Prescriptions  Medication Sig Dispense Refill  . atenolol (TENORMIN) 25 MG tablet Take 25 mg by mouth daily.    . Coenzyme Q10 (CO Q 10 PO) Take 1 capsule by mouth daily. Reported on 02/08/2016    . lidocaine-prilocaine (EMLA) cream Apply to affected area once 30 g 3  . LORazepam (ATIVAN) 0.5 MG tablet Take 1 tablet (0.5 mg total) by mouth at bedtime. (Patient not taking: Reported on 02/08/2016) 30 tablet 0  . Multiple Vitamins-Minerals (MULTIVITAMIN WITH MINERALS) tablet Take 1 tablet by mouth daily.    . Omega 3 1000 MG CAPS Take 2 capsules by mouth daily.    . ondansetron (ZOFRAN) 8 MG tablet Take 1 tablet (8 mg total) by mouth 2 (two) times daily as needed. Start on the third day after chemotherapy. (Patient not taking: Reported on 02/08/2016) 30 tablet 1  . prochlorperazine (COMPAZINE) 10 MG tablet Take 1 tablet (10 mg total) by mouth every 6 (six) hours as needed (Nausea or vomiting). (Patient not taking: Reported on 02/08/2016) 30 tablet 1   No current facility-administered medications for this visit.     PHYSICAL EXAMINATION: ECOG PERFORMANCE STATUS: 1  Vitals:   07/04/16 1159  BP: (!) 144/87  Pulse: 70  Resp: 18  Temp: 97.9 F (36.6 C)   Filed Weights   07/04/16 1159  Weight: 144 lb 6.4 oz (65.5 kg)    GENERAL:alert, no distress and comfortable SKIN: skin color, texture, turgor are normal, no rashes or  significant lesions EYES: normal, Conjunctiva are pink and non-injected, sclera clear OROPHARYNX:no exudate, no erythema and lips, buccal mucosa, and tongue normal  NECK: supple, thyroid normal size, non-tender, without nodularity LYMPH:  no palpable lymphadenopathy in the cervical, axillary or inguinal LUNGS: clear to auscultation and percussion with normal breathing effort HEART: regular rate & rhythm and no murmurs and no lower extremity edema ABDOMEN:abdomen soft,  non-tender and normal bowel sounds MUSCULOSKELETAL:no cyanosis of digits and no clubbing  NEURO: alert & oriented x 3 with fluent speech, no focal motor/sensory deficits EXTREMITIES: No lower extremity edema  LABORATORY DATA:  I have reviewed the data as listed   Chemistry      Component Value Date/Time   NA 139 06/27/2016 0846   K 4.3 06/27/2016 0846   CL 102 02/03/2016 1129   CO2 25 06/27/2016 0846   BUN 8.2 06/27/2016 0846   CREATININE 0.7 06/27/2016 0846      Component Value Date/Time   CALCIUM 9.5 06/27/2016 0846   ALKPHOS 63 06/27/2016 0846   AST 16 06/27/2016 0846   ALT 18 06/27/2016 0846   BILITOT 0.49 06/27/2016 0846       Lab Results  Component Value Date   WBC 3.3 (L) 06/27/2016   HGB 12.0 06/27/2016   HCT 35.5 06/27/2016   MCV 96.2 06/27/2016   PLT 238 06/27/2016   NEUTROABS 1.9 06/27/2016     ASSESSMENT & PLAN:  Breast cancer of upper-inner quadrant of right female breast ([place]) Right breast biopsy 01/20/2016 12:00: Invasive ductal carcinoma, grade 3, lymphovascular invasion is present, ER 0%, PR 0%, Ki-67 95%, HER-2 negative ratio 1.19; mamm and [place] irregular mass 2.9 x 1.9 x 1.6 cm, no axillary LN T2 N0 stage IIA clinical stage  Treatment planbased on multidisciplinary tumor board: 1. Neoadjuvant chemotherapy with Adriamycin and Cytoxan dose dense 4 followed by Abraxane weekly 12  started 02/16/2016 completed 06/27/2016 2. Followed byLumpectomy. (patient appears to be very worried that the mammograms did not pick up her breast cancer with mammograms may not be sufficient) 3. Followed by XRT 4.  Genetic counseling and testing on 03/26/2016: Normal; no mutations identified -------------------------------------------------------------------------------------- Breast MRI 07/16/2016: Radiologic complete response  Patient was presented in the tumor board and based on her preference, she will [facility] with XRT. She may not need adjuvant radiation therapy if she  does undergo bilateral mastectomies.  Return to clinic after surgery to discuss the results.   No orders of the defined types were placed in this encounter.  The patient has a good understanding of the overall plan. she agrees with it. she will call with any problems that may develop before the next visit here.   Rulon Eisenmenger, MD 07/04/16

## 2016-07-11 ENCOUNTER — Ambulatory Visit: Payer: Self-pay | Admitting: Surgery

## 2016-07-11 DIAGNOSIS — C50411 Malignant neoplasm of upper-outer quadrant of right female breast: Secondary | ICD-10-CM

## 2016-07-11 DIAGNOSIS — Z171 Estrogen receptor negative status [ER-]: Principal | ICD-10-CM

## 2016-07-23 ENCOUNTER — Other Ambulatory Visit: Payer: Self-pay | Admitting: Surgery

## 2016-07-23 DIAGNOSIS — C50411 Malignant neoplasm of upper-outer quadrant of right female breast: Secondary | ICD-10-CM

## 2016-07-23 DIAGNOSIS — Z171 Estrogen receptor negative status [ER-]: Principal | ICD-10-CM

## 2016-07-27 ENCOUNTER — Encounter (HOSPITAL_COMMUNITY): Payer: Self-pay

## 2016-08-03 ENCOUNTER — Telehealth: Payer: Self-pay | Admitting: *Deleted

## 2016-08-03 NOTE — Telephone Encounter (Addendum)
Called pt back and told her that situation discuss with Dr. [name], and recommends for her to come in on Monday to be evaluated by symptom management and have them take a picture of her swollen eye as well. Pt agreeable and will prefer to come in early afternoon. Sent scheduling msg for 1pm on Monday 11/20 to see syptom management. No further questions at this time.

## 2016-08-03 NOTE — Telephone Encounter (Signed)
Pt called reporting swollen eyes, and would like to speak with nurse.   Spoke with pt, and was informed that both eyes were puffy with swollen eyelids and underneath eyes - Left more swollen than Right for about 2 weeks.  Pt saw her PCP, and was given Cipro eye drops.  Pt has been using the eyedrop for about 1 week with NO relief.   Pt was instructed by her PCP to follow up with Dr. [name].  Denied pain, stated can still see.  The swelling was more in the am and at night. Pt's    Phone     [phone].

## 2016-08-06 ENCOUNTER — Ambulatory Visit (HOSPITAL_BASED_OUTPATIENT_CLINIC_OR_DEPARTMENT_OTHER): Payer: Managed Care, Other (non HMO)

## 2016-08-06 ENCOUNTER — Encounter: Payer: Self-pay | Admitting: Nurse Practitioner

## 2016-08-06 ENCOUNTER — Ambulatory Visit (HOSPITAL_BASED_OUTPATIENT_CLINIC_OR_DEPARTMENT_OTHER): Payer: Managed Care, Other (non HMO) | Admitting: Nurse Practitioner

## 2016-08-06 ENCOUNTER — Other Ambulatory Visit: Payer: Self-pay | Admitting: Nurse Practitioner

## 2016-08-06 VITALS — BP 146/78 | HR 76 | Temp 98.5°F | Resp 18 | Ht 65.0 in | Wt 145.1 lb

## 2016-08-06 DIAGNOSIS — R600 Localized edema: Secondary | ICD-10-CM

## 2016-08-06 DIAGNOSIS — C50211 Malignant neoplasm of upper-inner quadrant of right female breast: Secondary | ICD-10-CM

## 2016-08-06 LAB — CBC WITH DIFFERENTIAL/PLATELET
BASO%: 0.6 % (ref 0.0–2.0)
Basophils Absolute: 0 10*3/uL (ref 0.0–0.1)
EOS ABS: 0.3 10*3/uL (ref 0.0–0.5)
EOS%: 4.8 % (ref 0.0–7.0)
HCT: 37.4 % (ref 34.8–46.6)
HEMOGLOBIN: 12.6 g/dL (ref 11.6–15.9)
LYMPH%: 26.2 % (ref 14.0–49.7)
MCH: 31.6 pg (ref 25.1–34.0)
MCHC: 33.6 g/dL (ref 31.5–36.0)
MCV: 94.1 fL (ref 79.5–101.0)
MONO#: 0.5 10*3/uL (ref 0.1–0.9)
MONO%: 7.3 % (ref 0.0–14.0)
NEUT%: 61.1 % (ref 38.4–76.8)
NEUTROS ABS: 4.2 10*3/uL (ref 1.5–6.5)
Platelets: 282 10*3/uL (ref 145–400)
RBC: 3.98 10*6/uL (ref 3.70–5.45)
RDW: 13 % (ref 11.2–14.5)
WBC: 6.9 10*3/uL (ref 3.9–10.3)
lymph#: 1.8 10*3/uL (ref 0.9–3.3)

## 2016-08-06 LAB — COMPREHENSIVE METABOLIC PANEL
ALBUMIN: 4.2 g/dL (ref 3.5–5.0)
ALT: 20 U/L (ref 0–55)
AST: 15 U/L (ref 5–34)
Alkaline Phosphatase: 79 U/L (ref 40–150)
Anion Gap: 9 mEq/L (ref 3–11)
BILIRUBIN TOTAL: 0.56 mg/dL (ref 0.20–1.20)
BUN: 10.7 mg/dL (ref 7.0–26.0)
CO2: 27 meq/L (ref 22–29)
Calcium: 10 mg/dL (ref 8.4–10.4)
Chloride: 105 mEq/L (ref 98–109)
Creatinine: 0.7 mg/dL (ref 0.6–1.1)
GLUCOSE: 103 mg/dL (ref 70–140)
POTASSIUM: 3.9 meq/L (ref 3.5–5.1)
SODIUM: 141 meq/L (ref 136–145)
TOTAL PROTEIN: 8.1 g/dL (ref 6.4–8.3)

## 2016-08-06 LAB — TSH: TSH: 1.65 m(IU)/L (ref 0.308–3.960)

## 2016-08-06 MED ORDER — FUROSEMIDE 20 MG PO TABS: 20.0000 mg | ORAL_TABLET | Freq: Every day | ORAL | 0 refills | Status: DC | PRN

## 2016-08-06 MED ORDER — POTASSIUM CHLORIDE CRYS ER 20 MEQ PO TBCR: 20.0000 meq | EXTENDED_RELEASE_TABLET | Freq: Every day | ORAL | 0 refills | Status: DC | PRN

## 2016-08-06 NOTE — Assessment & Plan Note (Signed)
Patient completed her last chemotherapy on 06/27/2016.  She is scheduled next week to undergo a lumpectomy per Dr. Brantley Stage.  She is scheduled for her next follow-up visit on 08/24/2016.

## 2016-08-06 NOTE — Progress Notes (Addendum)
SYMPTOM MANAGEMENT CLINIC    Chief Complaint: Periorbital edema  HPI:  Elizabeth Cunningham 50 y.o. female diagnosed with breast cancer.  Just completed Abraxane chemotherapy 06/27/2016.  Patient is preparing for surgery next week.     Breast cancer of upper-inner quadrant of right female breast ([place])   01/20/2016 Initial Diagnosis    Right breast biopsy 12:00: Invasive ductal carcinoma, grade 3, lymphovascular invasion is present, ER 0%, PR 0%, Ki-67 95%, HER-2 negative ratio 1.19; mamm and [place] irregular mass 2.9 x 1.9 x 1.6 cm, no axillary LN T2 N0 stage IIA clinical stage      02/16/2016 - 06/27/2016 Neo-Adjuvant Chemotherapy    Dose dense Adriamycin and Cytoxan 4 followed by Abraxane weekly 12      05/04/2016 Miscellaneous    Genetic testing was normal      06/29/2016 Breast MRI    Complete radiologic response       Review of Systems  Skin:       Periorbital edema bilaterally.  All other systems reviewed and are negative.   Past Medical History:  Diagnosis Date  . Breast cancer ([place])    triple negative    History reviewed. No pertinent surgical history.  has Breast cancer of upper-inner quadrant of right female breast ([place]); Genetic testing; and Periorbital edema on her problem list.    has No Known Allergies.    Medication List       Accurate as of 08/06/16  5:17 PM. Always use your most recent med list.          atenolol 25 MG tablet Commonly known as:  TENORMIN Take 25 mg by mouth daily.   CO Q 10 PO Take 1 capsule by mouth daily. Reported on 02/08/2016   furosemide 20 MG tablet Commonly known as:  LASIX Take 1 tablet (20 mg total) by mouth daily as needed for edema.   lidocaine-prilocaine cream Commonly known as:  EMLA Apply to affected area once   LORazepam 0.5 MG tablet Commonly known as:  ATIVAN Take 1 tablet (0.5 mg total) by mouth at bedtime.   multivitamin with minerals tablet Take 1 tablet by mouth daily.   Omega 3 1000 MG  Caps Take 2 capsules by mouth daily.   ondansetron 8 MG tablet Commonly known as:  ZOFRAN Take 1 tablet (8 mg total) by mouth 2 (two) times daily as needed. Start on the third day after chemotherapy.   potassium chloride SA 20 MEQ tablet Commonly known as:  K-DUR,KLOR-CON Take 1 tablet (20 mEq total) by mouth daily as needed (When taking Lasix).   prochlorperazine 10 MG tablet Commonly known as:  COMPAZINE Take 1 tablet (10 mg total) by mouth every 6 (six) hours as needed (Nausea or vomiting).        PHYSICAL EXAMINATION  Oncology Vitals 08/06/2016 07/04/2016  Height 165 cm -  Weight 65.817 kg 65.499 kg  Weight (lbs) 145 lbs 2 oz 144 lbs 6 oz  BMI (kg/m2) 24.15 kg/m2 24.03 kg/m2  Temp 98.5 97.9  Pulse 76 70  Resp 18 18  SpO2 100 100  BSA (m2) 1.74 m2 1.73 m2   BP Readings from Last 2 Encounters:  08/06/16 (!) 146/78  07/04/16 (!) 144/87    Physical Exam  Constitutional: She is oriented to person, place, and time.  HENT:  Head: Normocephalic and atraumatic.  Eyes: EOM are normal. Pupils are equal, round, and reactive to light.  Patient has moderate periorbital edema to bilateral eyes.  Neck: Normal range of motion.  Pulmonary/Chest: Effort normal. No stridor. No respiratory distress.  Musculoskeletal: Normal range of motion. She exhibits no edema or tenderness.  Neurological: She is alert and oriented to person, place, and time. Gait normal.  Skin: Skin is warm and dry.  Psychiatric: Affect normal.  Nursing note and vitals reviewed.   LABORATORY DATA:. Appointment on 08/06/2016  Component Date Value Ref Range Status  . WBC 08/06/2016 6.9  3.9 - 10.3 10e3/uL Final  . NEUT# 08/06/2016 4.2  1.5 - 6.5 10e3/uL Final  . HGB 08/06/2016 12.6  11.6 - 15.9 g/dL Final  . HCT 08/06/2016 37.4  34.8 - 46.6 % Final  . Platelets 08/06/2016 282  145 - 400 10e3/uL Final  . MCV 08/06/2016 94.1  79.5 - 101.0 fL Final  . MCH 08/06/2016 31.6  25.1 - 34.0 pg Final  . MCHC  08/06/2016 33.6  31.5 - 36.0 g/dL Final  . RBC 08/06/2016 3.98  3.70 - 5.45 10e6/uL Final  . RDW 08/06/2016 13.0  11.2 - 14.5 % Final  . lymph# 08/06/2016 1.8  0.9 - 3.3 10e3/uL Final  . MONO# 08/06/2016 0.5  0.1 - 0.9 10e3/uL Final  . Eosinophils Absolute 08/06/2016 0.3  0.0 - 0.5 10e3/uL Final  . Basophils Absolute 08/06/2016 0.0  0.0 - 0.1 10e3/uL Final  . NEUT% 08/06/2016 61.1  38.4 - 76.8 % Final  . LYMPH% 08/06/2016 26.2  14.0 - 49.7 % Final  . MONO% 08/06/2016 7.3  0.0 - 14.0 % Final  . EOS% 08/06/2016 4.8  0.0 - 7.0 % Final  . BASO% 08/06/2016 0.6  0.0 - 2.0 % Final  . Sodium 08/06/2016 141  136 - 145 mEq/L Final  . Potassium 08/06/2016 3.9  3.5 - 5.1 mEq/L Final  . Chloride 08/06/2016 105  98 - 109 mEq/L Final  . CO2 08/06/2016 27  22 - 29 mEq/L Final  . Glucose 08/06/2016 103  70 - 140 mg/dl Final  . BUN 08/06/2016 10.7  7.0 - 26.0 mg/dL Final  . Creatinine 08/06/2016 0.7  0.6 - 1.1 mg/dL Final  . Total Bilirubin 08/06/2016 0.56  0.20 - 1.20 mg/dL Final  . Alkaline Phosphatase 08/06/2016 79  40 - 150 U/L Final  . AST 08/06/2016 15  5 - 34 U/L Final  . ALT 08/06/2016 20  0 - 55 U/L Final  . Total Protein 08/06/2016 8.1  6.4 - 8.3 g/dL Final  . Albumin 08/06/2016 4.2  3.5 - 5.0 g/dL Final  . Calcium 08/06/2016 10.0  8.4 - 10.4 mg/dL Final  . Anion Gap 08/06/2016 9  3 - 11 mEq/L Final  . EGFR 08/06/2016 >90  >90 ml/min/1.73 m2 Final  . TSH 08/06/2016 1.650  0.308 - 3.960 m(IU)/L Final       RADIOGRAPHIC STUDIES: No results found.  ASSESSMENT/PLAN:    Periorbital edema Patient states that she has been experiencing bilateral periorbital edema for approximately 3-1/2 weeks.  She states that she completed her last chemotherapy on 06/27/2016; and did experience excessive tearing and some discharge from both eyes while she was receiving chemotherapy.  She states she's had no further discharge, eye pain, vision changes, or other new symptoms since that time.  She states she has  tried an occasional Benadryl with minimal effectiveness.  Patient states that she went to see her primary care provider approximately a week or so ago; and he prescribed Cipro antibiotic eyedrops to see if this would help.  However, patient states that the  edema continues-but she does admit that the edema is slightly less than before.  Patient denies any new medications, shampoos, lotions, supplements, etc.  Exam today reveals moderate bilateral periorbital edema; but no other allergic-type symptoms whatsoever.  All labs drawn today, were within normal limits; including normal TSH.  Reviewed all findings with Dr. [name]; and he recommended withholding any steroids for the time being.  The concern was that the steroids may actually increase the patient's edema.  He advised trying Lasix on an as-needed basis.  Will prescribe Lasix 20 mg to take on a daily basis if needed for periorbital edema.  Also, patient will be prescribed potassium 20 mEq to take only on the days that she takes the Lasix.  Patient was also given both printed and verbal instructions regarding the use of both Benadryl and Pepcid to see if this helps with her symptoms as well.  She was encouraged to go directly to the emergency department for any worsening symptoms whatsoever.  Breast cancer of upper-inner quadrant of right female breast [facility]) Patient completed her last chemotherapy on 06/27/2016.  She is scheduled next week to undergo a lumpectomy per Dr. Brantley Stage.  She is scheduled for her next follow-up visit on 08/24/2016.   Patient stated understanding of all instructions; and was in agreement with this plan of care. The patient knows to call the clinic with any problems, questions or concerns.   Total time spent with patient was 25 minutes;  with greater than 75 percent of that time spent in face to face counseling regarding patient's symptoms,  and coordination of care and follow up.  Disclaimer:This dictation was  prepared with Dragon/digital dictation along with [employer]. Any transcriptional errors that result from this process are unintentional.  Drue Second, NP 08/06/2016

## 2016-08-06 NOTE — Assessment & Plan Note (Addendum)
Patient states that she has been experiencing bilateral periorbital edema for approximately 3-1/2 weeks.  She states that she completed her last chemotherapy on 06/27/2016; and did experience excessive tearing and some discharge from both eyes while she was receiving chemotherapy.  She states she's had no further discharge, eye pain, vision changes, or other new symptoms since that time.  She states she has tried an occasional Benadryl with minimal effectiveness.  Patient states that she went to see her primary care provider approximately a week or so ago; and he prescribed Cipro antibiotic eyedrops to see if this would help.  However, patient states that the edema continues-but she does admit that the edema is slightly less than before.  Patient denies any new medications, shampoos, lotions, supplements, etc.  Exam today reveals moderate bilateral periorbital edema; but no other allergic-type symptoms whatsoever.  All labs drawn today, were within normal limits; including normal TSH.  Reviewed all findings with Dr. [name]; and he recommended withholding any steroids for the time being.  The concern was that the steroids may actually increase the patient's edema.  He advised trying Lasix on an as-needed basis.  Will prescribe Lasix 20 mg to take on a daily basis if needed for periorbital edema.  Also, patient will be prescribed potassium 20 mEq to take only on the days that she takes the Lasix.  Patient was also given both printed and verbal instructions regarding the use of both Benadryl and Pepcid to see if this helps with her symptoms as well.  She was encouraged to go directly to the emergency department for any worsening symptoms whatsoever.

## 2016-08-07 ENCOUNTER — Encounter (HOSPITAL_BASED_OUTPATIENT_CLINIC_OR_DEPARTMENT_OTHER)
Admission: RE | Admit: 2016-08-07 | Discharge: 2016-08-07 | Disposition: A | Discharge status: Home / Self Care | Payer: Managed Care, Other (non HMO) | Source: Ambulatory Visit | Attending: Surgery | Admitting: Surgery

## 2016-08-07 ENCOUNTER — Other Ambulatory Visit: Payer: Self-pay

## 2016-08-07 ENCOUNTER — Encounter (HOSPITAL_BASED_OUTPATIENT_CLINIC_OR_DEPARTMENT_OTHER): Payer: Self-pay | Admitting: *Deleted

## 2016-08-07 DIAGNOSIS — I10 Essential (primary) hypertension: Secondary | ICD-10-CM | POA: Diagnosis not present

## 2016-08-07 DIAGNOSIS — Z01810 Encounter for preprocedural cardiovascular examination: Secondary | ICD-10-CM | POA: Diagnosis present

## 2016-08-07 DIAGNOSIS — C50211 Malignant neoplasm of upper-inner quadrant of right female breast: Secondary | ICD-10-CM | POA: Diagnosis not present

## 2016-08-08 ENCOUNTER — Other Ambulatory Visit: Payer: Self-pay | Admitting: Nurse Practitioner

## 2016-08-08 ENCOUNTER — Telehealth: Payer: Self-pay | Admitting: *Deleted

## 2016-08-08 DIAGNOSIS — R600 Localized edema: Secondary | ICD-10-CM

## 2016-08-08 MED ORDER — METHYLPREDNISOLONE 4 MG PO TBPK
ORAL_TABLET | ORAL | 0 refills | Status: DC
Start: 2016-08-08 — End: 2016-09-13

## 2016-08-08 NOTE — Telephone Encounter (Signed)
TCT patient to follow on her visit in [facility] on 08/06/16 for periorbital edema. Spoke with patient. She states she chose not to try the lasix for the swelling but xhose the benadryl and pepcid combination.  She states she has not seen any improvement. She denies discomfort , no drainage or crusting from eyes-just swelling.  Discussed with Selena Lesser, NP.  Cyndee considering Medrol dospak if pt is agreeable. This dospak would end prior to her surgery on 08/15/16.  TCT patient again. She is agreeable to trying the medrol dospak.  The prescription is being sent in to her pharmacy in [place], [place].  Will call pt on Monday morning, 08/13/16 to see how she is doing.  Pt voiced understanding.

## 2016-08-13 ENCOUNTER — Ambulatory Visit
Admission: RE | Admit: 2016-08-13 | Discharge: 2016-08-13 | Disposition: A | Discharge status: Home / Self Care | Payer: Managed Care, Other (non HMO) | Source: Ambulatory Visit | Attending: Surgery | Admitting: Surgery

## 2016-08-13 DIAGNOSIS — Z171 Estrogen receptor negative status [ER-]: Principal | ICD-10-CM

## 2016-08-13 DIAGNOSIS — C50411 Malignant neoplasm of upper-outer quadrant of right female breast: Secondary | ICD-10-CM

## 2016-08-14 ENCOUNTER — Telehealth: Payer: Self-pay | Admitting: *Deleted

## 2016-08-14 NOTE — Telephone Encounter (Signed)
TCT patient in follow up to [facility] visit for periorbital edema on 08/06/16. Pt 's swelling had not changed with Benadryl/pepcid regime. So a medrol dospak was prescribed for her.  Pt not home but spoke with pt's sister. She states that the swelling around her eyes looks improved. Pt is scheduled for lumpectomy tomorrow. Pt's sister did not voice and concerns or questions at this time.  She will relay the call to patient.

## 2016-08-15 ENCOUNTER — Ambulatory Visit (HOSPITAL_BASED_OUTPATIENT_CLINIC_OR_DEPARTMENT_OTHER): Payer: Managed Care, Other (non HMO) | Admitting: Anesthesiology

## 2016-08-15 ENCOUNTER — Ambulatory Visit
Admission: RE | Admit: 2016-08-15 | Discharge: 2016-08-15 | Disposition: A | Discharge status: Home / Self Care | Payer: Managed Care, Other (non HMO) | Source: Ambulatory Visit | Attending: Surgery | Admitting: Surgery

## 2016-08-15 ENCOUNTER — Encounter (HOSPITAL_BASED_OUTPATIENT_CLINIC_OR_DEPARTMENT_OTHER): Payer: Self-pay | Admitting: *Deleted

## 2016-08-15 ENCOUNTER — Ambulatory Visit (HOSPITAL_COMMUNITY): Payer: Managed Care, Other (non HMO)

## 2016-08-15 ENCOUNTER — Ambulatory Visit (HOSPITAL_BASED_OUTPATIENT_CLINIC_OR_DEPARTMENT_OTHER)
Admission: RE | Admit: 2016-08-15 | Discharge: 2016-08-15 | Disposition: A | Discharge status: Home / Self Care | Payer: Managed Care, Other (non HMO) | Source: Ambulatory Visit | Attending: Surgery | Admitting: Surgery

## 2016-08-15 ENCOUNTER — Encounter (HOSPITAL_BASED_OUTPATIENT_CLINIC_OR_DEPARTMENT_OTHER)
Admission: RE | Disposition: A | Discharge status: Home / Self Care | Payer: Self-pay | Source: Ambulatory Visit | Attending: Surgery

## 2016-08-15 ENCOUNTER — Encounter (HOSPITAL_COMMUNITY)
Admission: RE | Admit: 2016-08-15 | Discharge: 2016-08-15 | Disposition: A | Discharge status: Home / Self Care | Payer: Managed Care, Other (non HMO) | Source: Ambulatory Visit | Attending: Surgery | Admitting: Surgery

## 2016-08-15 DIAGNOSIS — Z171 Estrogen receptor negative status [ER-]: Secondary | ICD-10-CM | POA: Diagnosis not present

## 2016-08-15 DIAGNOSIS — C50411 Malignant neoplasm of upper-outer quadrant of right female breast: Secondary | ICD-10-CM

## 2016-08-15 DIAGNOSIS — I10 Essential (primary) hypertension: Secondary | ICD-10-CM | POA: Diagnosis not present

## 2016-08-15 DIAGNOSIS — C50911 Malignant neoplasm of unspecified site of right female breast: Secondary | ICD-10-CM | POA: Diagnosis present

## 2016-08-15 DIAGNOSIS — C50111 Malignant neoplasm of central portion of right female breast: Secondary | ICD-10-CM | POA: Insufficient documentation

## 2016-08-15 DIAGNOSIS — Z79899 Other long term (current) drug therapy: Secondary | ICD-10-CM | POA: Diagnosis not present

## 2016-08-15 DIAGNOSIS — Z9221 Personal history of antineoplastic chemotherapy: Secondary | ICD-10-CM | POA: Insufficient documentation

## 2016-08-15 HISTORY — DX: Essential (primary) hypertension: I10

## 2016-08-15 HISTORY — PX: BREAST LUMPECTOMY WITH RADIOACTIVE SEED AND SENTINEL LYMPH NODE BIOPSY: SHX6550

## 2016-08-15 HISTORY — PX: BREAST LUMPECTOMY: SHX2

## 2016-08-15 SURGERY — BREAST LUMPECTOMY WITH RADIOACTIVE SEED AND SENTINEL LYMPH NODE BIOPSY
Anesthesia: Regional | Site: Breast | Laterality: Right

## 2016-08-15 MED ORDER — LACTATED RINGERS IV SOLN
INTRAVENOUS | Status: DC
  Administered 2016-08-15 (×2): via INTRAVENOUS

## 2016-08-15 MED ORDER — OXYCODONE-ACETAMINOPHEN 5-325 MG PO TABS
1.0000 | ORAL_TABLET | Freq: Once | ORAL | Status: AC
  Administered 2016-08-15: 1 via ORAL

## 2016-08-15 MED ORDER — PHENYLEPHRINE HCL 10 MG/ML IJ SOLN
INTRAMUSCULAR | Status: DC | PRN
Start: 2016-08-15 — End: 2016-08-15
  Administered 2016-08-15: 80 ug via INTRAVENOUS

## 2016-08-15 MED ORDER — BUPIVACAINE-EPINEPHRINE (PF) 0.25% -1:200000 IJ SOLN
INTRAMUSCULAR | Status: DC | PRN
  Administered 2016-08-15: 10 mL via PERINEURAL

## 2016-08-15 MED ORDER — PROPOFOL 10 MG/ML IV BOLUS
INTRAVENOUS | Status: DC | PRN
  Administered 2016-08-15: 150 mg via INTRAVENOUS

## 2016-08-15 MED ORDER — OXYCODONE-ACETAMINOPHEN 5-325 MG PO TABS
1.0000 | ORAL_TABLET | ORAL | 0 refills | Status: DC | PRN
Start: 2016-08-15 — End: 2016-09-13

## 2016-08-15 MED ORDER — EPHEDRINE 5 MG/ML INJ
INTRAVENOUS | Status: AC
  Filled 2016-08-15: qty 10

## 2016-08-15 MED ORDER — LIDOCAINE 2% (20 MG/ML) 5 ML SYRINGE
INTRAMUSCULAR | Status: AC
  Filled 2016-08-15: qty 5

## 2016-08-15 MED ORDER — FENTANYL CITRATE (PF) 100 MCG/2ML IJ SOLN
INTRAMUSCULAR | Status: AC
  Filled 2016-08-15: qty 2

## 2016-08-15 MED ORDER — CELECOXIB 400 MG PO CAPS
400.0000 mg | ORAL_CAPSULE | ORAL | Status: AC
  Administered 2016-08-15: 400 mg via ORAL

## 2016-08-15 MED ORDER — CHLORHEXIDINE GLUCONATE CLOTH 2 % EX PADS: 6.0000 | MEDICATED_PAD | Freq: Once | CUTANEOUS | Status: DC

## 2016-08-15 MED ORDER — DEXAMETHASONE SODIUM PHOSPHATE 10 MG/ML IJ SOLN
INTRAMUSCULAR | Status: AC
  Filled 2016-08-15: qty 1

## 2016-08-15 MED ORDER — MIDAZOLAM HCL 2 MG/2ML IJ SOLN
INTRAMUSCULAR | Status: AC
  Filled 2016-08-15: qty 2

## 2016-08-15 MED ORDER — EPHEDRINE SULFATE 50 MG/ML IJ SOLN
INTRAMUSCULAR | Status: DC | PRN
  Administered 2016-08-15: 10 mg via INTRAVENOUS
  Administered 2016-08-15: 15 mg via INTRAVENOUS

## 2016-08-15 MED ORDER — HYDROCODONE-ACETAMINOPHEN 5-325 MG PO TABS: 1.0000 | ORAL_TABLET | Freq: Once | ORAL | Status: DC | PRN

## 2016-08-15 MED ORDER — DEXTROSE 5 % IV SOLN
3.0000 g | INTRAVENOUS | Status: AC
  Administered 2016-08-15: 2 g via INTRAVENOUS

## 2016-08-15 MED ORDER — ONDANSETRON HCL 4 MG/2ML IJ SOLN
INTRAMUSCULAR | Status: AC
  Filled 2016-08-15: qty 2

## 2016-08-15 MED ORDER — ACETAMINOPHEN 500 MG PO TABS
1000.0000 mg | ORAL_TABLET | ORAL | Status: AC
  Administered 2016-08-15: 1000 mg via ORAL

## 2016-08-15 MED ORDER — GABAPENTIN 300 MG PO CAPS
300.0000 mg | ORAL_CAPSULE | ORAL | Status: AC
  Administered 2016-08-15: 300 mg via ORAL

## 2016-08-15 MED ORDER — BUPIVACAINE-EPINEPHRINE (PF) 0.5% -1:200000 IJ SOLN
INTRAMUSCULAR | Status: DC | PRN
  Administered 2016-08-15: 30 mL

## 2016-08-15 MED ORDER — CEFAZOLIN SODIUM-DEXTROSE 2-4 GM/100ML-% IV SOLN
INTRAVENOUS | Status: AC
  Filled 2016-08-15: qty 100

## 2016-08-15 MED ORDER — ATROPINE SULFATE 0.4 MG/ML IV SOSY
PREFILLED_SYRINGE | INTRAVENOUS | Status: AC
  Filled 2016-08-15: qty 2.5

## 2016-08-15 MED ORDER — TECHNETIUM TC 99M SULFUR COLLOID FILTERED
1.0000 | Freq: Once | INTRAVENOUS | Status: AC | PRN
  Administered 2016-08-15: 1 via INTRADERMAL

## 2016-08-15 MED ORDER — PHENYLEPHRINE 40 MCG/ML (10ML) SYRINGE FOR IV PUSH (FOR BLOOD PRESSURE SUPPORT)
PREFILLED_SYRINGE | INTRAVENOUS | Status: AC
  Filled 2016-08-15: qty 10

## 2016-08-15 MED ORDER — PHENYLEPHRINE HCL 10 MG/ML IJ SOLN
INTRAMUSCULAR | Status: AC
  Filled 2016-08-15: qty 1

## 2016-08-15 MED ORDER — SCOPOLAMINE 1 MG/3DAYS TD PT72: 1.0000 | MEDICATED_PATCH | Freq: Once | TRANSDERMAL | Status: DC | PRN

## 2016-08-15 MED ORDER — ACETAMINOPHEN 500 MG PO TABS
ORAL_TABLET | ORAL | Status: AC
  Filled 2016-08-15: qty 2

## 2016-08-15 MED ORDER — ARTIFICIAL TEARS OP OINT
TOPICAL_OINTMENT | OPHTHALMIC | Status: AC
  Filled 2016-08-15: qty 3.5

## 2016-08-15 MED ORDER — LIDOCAINE HCL (CARDIAC) 20 MG/ML IV SOLN
INTRAVENOUS | Status: DC | PRN
  Administered 2016-08-15: 30 mg via INTRAVENOUS

## 2016-08-15 MED ORDER — ONDANSETRON HCL 4 MG/2ML IJ SOLN
INTRAMUSCULAR | Status: DC | PRN
  Administered 2016-08-15: 4 mg via INTRAVENOUS

## 2016-08-15 MED ORDER — FENTANYL CITRATE (PF) 100 MCG/2ML IJ SOLN
50.0000 ug | INTRAMUSCULAR | Status: AC | PRN
  Administered 2016-08-15: 50 ug via INTRAVENOUS
  Administered 2016-08-15 (×2): 100 ug via INTRAVENOUS

## 2016-08-15 MED ORDER — MIDAZOLAM HCL 2 MG/2ML IJ SOLN
1.0000 mg | INTRAMUSCULAR | Status: DC | PRN
  Administered 2016-08-15: 2 mg via INTRAVENOUS
  Administered 2016-08-15: 1 mg via INTRAVENOUS
  Administered 2016-08-15: 2 mg via INTRAVENOUS

## 2016-08-15 MED ORDER — DEXAMETHASONE SODIUM PHOSPHATE 4 MG/ML IJ SOLN
INTRAMUSCULAR | Status: DC | PRN
  Administered 2016-08-15: 10 mg via INTRAVENOUS

## 2016-08-15 MED ORDER — CELECOXIB 200 MG PO CAPS
ORAL_CAPSULE | ORAL | Status: AC
  Filled 2016-08-15: qty 2

## 2016-08-15 MED ORDER — PHENYLEPHRINE HCL 10 MG/ML IJ SOLN
INTRAVENOUS | Status: DC | PRN
  Administered 2016-08-15: 40 ug/min via INTRAVENOUS

## 2016-08-15 MED ORDER — HYDROMORPHONE HCL 1 MG/ML IJ SOLN: 0.2500 mg | INTRAMUSCULAR | Status: DC | PRN

## 2016-08-15 MED ORDER — SUCCINYLCHOLINE CHLORIDE 200 MG/10ML IV SOSY
PREFILLED_SYRINGE | INTRAVENOUS | Status: AC
  Filled 2016-08-15: qty 10

## 2016-08-15 MED ORDER — GABAPENTIN 300 MG PO CAPS
ORAL_CAPSULE | ORAL | Status: AC
  Filled 2016-08-15: qty 1

## 2016-08-15 MED ORDER — OXYCODONE-ACETAMINOPHEN 5-325 MG PO TABS
ORAL_TABLET | ORAL | Status: AC
  Filled 2016-08-15: qty 1

## 2016-08-15 MED ORDER — METHYLENE BLUE 0.5 % INJ SOLN
INTRAVENOUS | Status: DC | PRN
  Administered 2016-08-15: 5 mL

## 2016-08-15 SURGICAL SUPPLY — 46 items
APPLIER CLIP 9.375 MED OPEN (MISCELLANEOUS) ×2
BINDER BREAST LRG (GAUZE/BANDAGES/DRESSINGS) IMPLANT
BINDER BREAST MEDIUM (GAUZE/BANDAGES/DRESSINGS) IMPLANT
BINDER BREAST XLRG (GAUZE/BANDAGES/DRESSINGS) IMPLANT
BINDER BREAST XXLRG (GAUZE/BANDAGES/DRESSINGS) IMPLANT
BLADE SURG 15 STRL LF DISP TIS (BLADE) ×2 IMPLANT
BLADE SURG 15 STRL SS (BLADE) ×2
CANISTER SUC SOCK COL 7IN (MISCELLANEOUS) IMPLANT
CANISTER SUCT 1200ML W/VALVE (MISCELLANEOUS) ×2 IMPLANT
CHLORAPREP W/TINT 26ML (MISCELLANEOUS) ×2 IMPLANT
CLIP APPLIE 9.375 MED OPEN (MISCELLANEOUS) ×1 IMPLANT
COVER BACK TABLE 60X90IN (DRAPES) ×2 IMPLANT
COVER MAYO STAND STRL (DRAPES) ×2 IMPLANT
COVER PROBE W GEL 5X96 (DRAPES) ×2 IMPLANT
DECANTER SPIKE VIAL GLASS SM (MISCELLANEOUS) IMPLANT
DERMABOND ADVANCED (GAUZE/BANDAGES/DRESSINGS) ×1
DERMABOND ADVANCED .7 DNX12 (GAUZE/BANDAGES/DRESSINGS) ×1 IMPLANT
DEVICE DUBIN W/COMP PLATE 8390 (MISCELLANEOUS) ×2 IMPLANT
DRAPE LAPAROSCOPIC ABDOMINAL (DRAPES) ×2 IMPLANT
DRAPE UTILITY XL STRL (DRAPES) ×2 IMPLANT
ELECT COATED BLADE 2.86 ST (ELECTRODE) ×2 IMPLANT
ELECT REM PT RETURN 9FT ADLT (ELECTROSURGICAL) ×2
ELECTRODE REM PT RTRN 9FT ADLT (ELECTROSURGICAL) ×1 IMPLANT
GLOVE BIOGEL PI IND STRL 8 (GLOVE) ×1 IMPLANT
GLOVE BIOGEL PI INDICATOR 8 (GLOVE) ×1
GLOVE ECLIPSE 8.0 STRL XLNG CF (GLOVE) ×2 IMPLANT
GOWN STRL REUS W/ TWL LRG LVL3 (GOWN DISPOSABLE) ×2 IMPLANT
GOWN STRL REUS W/TWL LRG LVL3 (GOWN DISPOSABLE) ×2
HEMOSTAT SNOW SURGICEL 2X4 (HEMOSTASIS) ×4 IMPLANT
ILLUMINATOR WAVEGUIDE N/F (MISCELLANEOUS) ×2 IMPLANT
KIT MARKER MARGIN INK (KITS) ×2 IMPLANT
NDL SAFETY ECLIPSE 18X1.5 (NEEDLE) IMPLANT
NEEDLE HYPO 18GX1.5 SHARP (NEEDLE)
NEEDLE HYPO 25X1 1.5 SAFETY (NEEDLE) ×2 IMPLANT
NS IRRIG 1000ML POUR BTL (IV SOLUTION) ×2 IMPLANT
PACK BASIN DAY SURGERY FS (CUSTOM PROCEDURE TRAY) ×2 IMPLANT
PENCIL BUTTON HOLSTER BLD 10FT (ELECTRODE) ×2 IMPLANT
SLEEVE SCD COMPRESS KNEE MED (MISCELLANEOUS) ×2 IMPLANT
SPONGE LAP 4X18 X RAY DECT (DISPOSABLE) ×2 IMPLANT
SUT MNCRL AB 4-0 PS2 18 (SUTURE) ×4 IMPLANT
SUT VICRYL 3-0 CR8 SH (SUTURE) ×4 IMPLANT
SYR CONTROL 10ML LL (SYRINGE) ×2 IMPLANT
TOWEL OR 17X24 6PK STRL BLUE (TOWEL DISPOSABLE) ×2 IMPLANT
TOWEL OR NON WOVEN STRL DISP B (DISPOSABLE) ×2 IMPLANT
TUBE CONNECTING 20X1/4 (TUBING) ×2 IMPLANT
YANKAUER SUCT BULB TIP NO VENT (SUCTIONS) ×2 IMPLANT

## 2016-08-15 NOTE — Progress Notes (Signed)
Assisted Dr. Edmond Fitzgerald with right, ultrasound guided, pectoralis block. Side rails up, monitors on throughout procedure. See vital signs in flow sheet. Tolerated Procedure well. 

## 2016-08-15 NOTE — Interval H&P Note (Signed)
History and Physical Interval Note:  08/15/2016 1:41 PM  Elizabeth Cunningham  has presented today for surgery, with the diagnosis of RIGHT BREAST CANCER  The various methods of treatment have been discussed with the patient and family. After consideration of risks, benefits and other options for treatment, the patient has consented to  Procedure(s): BREAST LUMPECTOMY WITH RADIOACTIVE SEED AND SENTINEL LYMPH NODE BIOPSY (Right) as a surgical intervention .  The patient's history has been reviewed, patient examined, no change in status, stable for surgery.  I have reviewed the patient's chart and labs.  Questions were answered to the patient's satisfaction.     Needham Biggins A.

## 2016-08-15 NOTE — Anesthesia Preprocedure Evaluation (Addendum)
Anesthesia Evaluation  Patient identified by MRN, date of birth, ID band Patient awake    Reviewed: Allergy & Precautions, H&P , NPO status , Patient's Chart, lab work & pertinent test results, reviewed documented beta blocker date and time   Airway Mallampati: II  TM Distance: >3 FB Neck ROM: Full    Dental no notable dental hx. (+) Teeth Intact, Dental Advisory Given   Pulmonary neg pulmonary ROS,    Pulmonary exam normal breath sounds clear to auscultation       Cardiovascular hypertension, Pt. on medications and Pt. on home beta blockers  Rhythm:Regular Rate:Normal     Neuro/Psych negative neurological ROS  negative psych ROS   GI/Hepatic negative GI ROS, Neg liver ROS,   Endo/Other  negative endocrine ROS  Renal/GU negative Renal ROS  negative genitourinary   Musculoskeletal   Abdominal   Peds  Hematology negative hematology ROS (+)   Anesthesia Other Findings   Reproductive/Obstetrics negative OB ROS                            Anesthesia Physical Anesthesia Plan  ASA: II  Anesthesia Plan: General and Regional   Post-op Pain Management: GA combined w/ Regional for post-op pain   Induction: Intravenous  Airway Management Planned: LMA  Additional Equipment:   Intra-op Plan:   Post-operative Plan: Extubation in OR  Informed Consent: I have reviewed the patients History and Physical, chart, labs and discussed the procedure including the risks, benefits and alternatives for the proposed anesthesia with the patient or authorized representative who has indicated his/her understanding and acceptance.   Dental advisory given  Plan Discussed with: CRNA  Anesthesia Plan Comments:         Anesthesia Quick Evaluation

## 2016-08-15 NOTE — Anesthesia Procedure Notes (Signed)
Procedure Name: LMA Insertion Date/Time: 08/15/2016 2:04 PM Performed by: Melynda Ripple D Pre-anesthesia Checklist: Patient identified, Emergency Drugs available, Suction available and Patient being monitored Patient Re-evaluated:Patient Re-evaluated prior to inductionOxygen Delivery Method: Circle system utilized Preoxygenation: Pre-oxygenation with 100% oxygen Intubation Type: IV induction Ventilation: Mask ventilation without difficulty LMA: LMA inserted LMA Size: 4.0 Number of attempts: 1 Airway Equipment and Method: Bite block Placement Confirmation: positive ETCO2 Tube secured with: Tape Dental Injury: Teeth and Oropharynx as per pre-operative assessment

## 2016-08-15 NOTE — Op Note (Signed)
Preoperative diagnosis: Stage II right breast cancer  Postoperative diagnosis: Same  Procedure: Right breast seed localized partial mastectomy with right axillary sentinel lymph node mapping deep with methylene blue dye  Surgeon: Erroll Luna M.D.  Anesthesia: LMA with 0.25% Sensorcaine local with epinephrine  EBL: Minimal  Specimen: Right breast mass seeding clip with 3 right axillary sentinel lymph nodes hot and blue  Drains: None  Indications for procedure: The patient's a 50 year old female who is completed neoadjuvant chemotherapy. She presents for breast conservation today and has elected to undergo a right breast partial mastectomy with right axillary sentinel lymph node mapping.The procedure has been discussed with the patient. Alternatives to surgery have been discussed with the patient.  Risks of surgery include bleeding,  Infection,  Seroma formation, death,  and the need for further surgery.   The patient understands and wishes to proceed.Sentinel lymph node mapping and dissection has been discussed with the patient.  Risk of bleeding,  Infection,  Seroma formation,  Additional procedures,,  Shoulder weakness ,  Shoulder stiffness,  Nerve and blood vessel injury and reaction to the mapping dyes have been discussed.  Alternatives to surgery have been discussed with the patient.  The patient agrees to proceed.  Description of procedure: The patient was met in the holding area. She had  undergone clip placement in nuclear medicine injection. Neoprobe was used and the clip was identified in the right breast. This was marked as correct side. Questions are answered. She was taken back to the operating room and placed upon the OR table. After induction of LMA anesthesia, the right breast was prepped and draped in a sterile fashion and timeout was done. She received preoperative antibiotics. 4 mL of methylene blue were injected in a subareolar position and massaged. Neoprobe was used and the  hotspot was identified in the central upper right breast. Curvilinear incision was made along the superior aspect of the nipple areolar complex. Retractors were used to expose the area in the right upper breast and all tissue around the seeding clip were removed with a grossly negative margin. Neoprobe was used to see was in the specimen. Radiograph revealed both the seeding clip to be in the central portion of the specimen. The specimen was oriented with ink and sent to pathology for further evaluation. Cavity is made hemostatic with cautery and closed with 3-0 Vicryl and 4-0 Monocryl after placing clips to mark the cavity.  Neoprobe was used and switch to technetium settings. The right exam low was examined the. Hotspot identified in the right axilla. A 4 cm incision was made along the inferior border of the right axilla. Dissection was carried down into the right axilla and 3 blue hot sentinel nodes were found which were deep. These are level I axillary nodes in were deep.  Hemostasis achieved with cautery and Surgicel snow. Background counts approached 0. Wound was closed with 3-0 Vicryl and 4-0 Monocryl. Liver he's applied to both incisions. All final counts found to be correct. A breast binder placed. The patient was awoke taken to recovery in satisfactory condition.

## 2016-08-15 NOTE — Discharge Instructions (Signed)
[facility] °Office Phone Number [phone] ° °BREAST BIOPSY/ PARTIAL MASTECTOMY: POST OP INSTRUCTIONS ° °Always review your discharge instruction sheet given to you by the facility where your surgery was performed. ° °IF YOU HAVE DISABILITY OR FAMILY LEAVE FORMS, YOU MUST BRING THEM TO THE OFFICE FOR PROCESSING.  DO NOT GIVE THEM TO YOUR DOCTOR. ° °1. A prescription for pain medication may be given to you upon discharge.  Take your pain medication as prescribed, if needed.  If narcotic pain medicine is not needed, then you may take acetaminophen (Tylenol) or ibuprofen (Advil) as needed. °2. Take your usually prescribed medications unless otherwise directed °3. If you need a refill on your pain medication, please contact your pharmacy.  They will contact our office to request authorization.  Prescriptions will not be filled after 5pm or on week-ends. °4. You should eat very light the first 24 hours after surgery, such as soup, crackers, pudding, etc.  Resume your normal diet the day after surgery. °5. Most patients will experience some swelling and bruising in the breast.  Ice packs and a good support bra will help.  Swelling and bruising can take several days to resolve.  °6. It is common to experience some constipation if taking pain medication after surgery.  Increasing fluid intake and taking a stool softener will usually help or prevent this problem from occurring.  A mild laxative (Milk of Magnesia or Miralax) should be taken according to package directions if there are no bowel movements after 48 hours. °7. Unless discharge instructions indicate otherwise, you may remove your bandages 24-48 hours after surgery, and you may shower at that time.  You may have steri-strips (small skin tapes) in place directly over the incision.  These strips should be left on the skin for 7-10 days.  If your surgeon used skin glue on the incision, you may shower in 24 hours.  The glue will flake off over the  next 2-3 weeks.  Any sutures or staples will be removed at the office during your follow-up visit. °8. ACTIVITIES:  You may resume regular daily activities (gradually increasing) beginning the next day.  Wearing a good support bra or sports bra minimizes pain and swelling.  You may have sexual intercourse when it is comfortable. °a. You may drive when you no longer are taking prescription pain medication, you can comfortably wear a seatbelt, and you can safely maneuver your car and apply brakes. °b. RETURN TO WORK:  ______________________________________________________________________________________ °9. You should see your doctor in the office for a follow-up appointment approximately two weeks after your surgery.  Your doctor’s nurse will typically make your follow-up appointment when she calls you with your pathology report.  Expect your pathology report 2-3 business days after your surgery.  You may call to check if you do not hear from us after three days. °10. OTHER INSTRUCTIONS: _______________________________________________________________________________________________ _____________________________________________________________________________________________________________________________________ °_____________________________________________________________________________________________________________________________________ °_____________________________________________________________________________________________________________________________________ ° °WHEN TO CALL YOUR DOCTOR: °1. Fever over 101.0 °2. Nausea and/or vomiting. °3. Extreme swelling or bruising. °4. Continued bleeding from incision. °5. Increased pain, redness, or drainage from the incision. ° °The clinic staff is available to answer your questions during regular business hours.  Please don’t hesitate to call and ask to speak to one of the nurses for clinical concerns.  If you have a medical emergency, go to the nearest  emergency room or call 911.  A surgeon from [facility] is always on call at the hospital. ° °For further questions, please visit [email or web address]  ° ° ° °  Post Anesthesia Home Care Instructions ° °Activity: °Get plenty of rest for the remainder of the day. A responsible adult should stay with you for 24 hours following the procedure.  °For the next 24 hours, DO NOT: °-Drive a car °-Operate machinery °-Drink alcoholic beverages °-Take any medication unless instructed by your physician °-Make any legal decisions or sign important papers. ° °Meals: °Start with liquid foods such as gelatin or soup. Progress to regular foods as tolerated. Avoid greasy, spicy, heavy foods. If nausea and/or vomiting occur, drink only clear liquids until the nausea and/or vomiting subsides. Call your physician if vomiting continues. ° °Special Instructions/Symptoms: °Your throat may feel dry or sore from the anesthesia or the breathing tube placed in your throat during surgery. If this causes discomfort, gargle with warm salt water. The discomfort should disappear within 24 hours. ° °If you had a scopolamine patch placed behind your ear for the management of post- operative nausea and/or vomiting: ° °1. The medication in the patch is effective for 72 hours, after which it should be removed.  Wrap patch in a tissue and discard in the trash. Wash hands thoroughly with soap and water. °2. You may remove the patch earlier than 72 hours if you experience unpleasant side effects which may include dry mouth, dizziness or visual disturbances. °3. Avoid touching the patch. Wash your hands with soap and water after contact with the patch. °  ° °

## 2016-08-15 NOTE — H&P (Signed)
Pre-op/Pre-procedure Orders Open     [facility]  Erroll Luna, MD  General Surgery   Additional Documentation   Encounter Info:   Billing Info,   History,   Allergies,   Detailed Report     All Notes   H&P by Erroll Luna, MD at 07/02/2016 12:09 PM   Author: Erroll Luna, MD Author Type: Physician Filed:  12:09 PM  Note Status: Signed Cosign: Cosign Not Required Encounter Date: 12:09 PM  Editor: Erroll Luna, MD (Physician)    Fredric Mare Location: [facility] Patient #: [id number] DOB: Mar 25, 1966 Married / Language: [name] / Race: White Female  History of Present Illness (Hayes Czaja A. Ezrael Sam MD;  12:06 PM) Patient words: Patient returns for follow-up after neoadjuvant chemotherapy for stage II right breast cancer triple negative.  MRI was done which shows a complete response.  She has very dense tissue in her breast therefore making mammography less sensitive in her case. She initially wanted bilateral mastectomies given this fact but is here today to discuss surgery. She will keep her port for a study that she is enrolling into her breast cancer. She isn't very well chemotherapy with minimal side effects.                                01/20/2016 Initial Diagnosis Right breast biopsy 12:00: Invasive ductal carcinoma, grade 3, lymphovascular invasion is present, ER 0%, PR 0%, Ki-67 95%, HER-2 negative ratio 1.19; mamm and [place] irregular mass 2.9 x 1.9 x 1.6 cm, no axillary LN T2 N0 stage II    Janylah Belgrave is a 50 year old with above-mentioned history of right breast cancer currently on neoadjuvant chemotherapy and today is cycle 12 Abraxane and is the last cycle of chemotherapy. She tolerated chemotherapy extremely well. She did not have any neuropathy. Denies any nausea vomiting. She has remained fairly active and did not have any fevers or chills.  Right breast  biopsy 01/20/2016 12:00: Invasive ductal carcinoma, grade 3, lymphovascular invasion is present, ER 0%, PR 0%, Ki-67 95%, HER-2 negative ratio 1.19; mamm and [place] irregular mass 2.9 x 1.9 x 1.6 cm, no axillary LN T2 N0 stage IIA clinical stage  Treatment plan based on multidisciplinary tumor board: 1. Neoadjuvant chemotherapy with Adriamycin and Cytoxan dose dense 4 followed by Abraxane weekly 12 2. bilateral mastectomies. (patient appears to be very worried that the mammograms did not pick up her breast cancer with mammograms may not be sufficient) 3. Followed by adjuvant radiation therapy ( will be necessary only if she undergoes lumpectomy) 4. Genetic counseling and testing on 03/26/2016: Negative    CLINICAL DATA: Patient has history of right breast cancer, assess for neoadjuvant therapy response.  LABS: Does not apply  EXAM: BILATERAL BREAST MRI WITH AND WITHOUT CONTRAST  TECHNIQUE: Multiplanar, multisequence MR images of both breasts were obtained prior to and following the intravenous administration of 13 ml of MultiHance.  THREE-DIMENSIONAL MR IMAGE RENDERING ON INDEPENDENT WORKSTATION:  Three-dimensional MR images were rendered by post-processing of the original MR data on an independent workstation. The three-dimensional MR images were interpreted, and findings are reported in the following complete MRI report for this study. Three dimensional images were evaluated at the independent DynaCad workstation  COMPARISON: Previous exam(s).  FINDINGS: Breast composition: c. Heterogeneous fibroglandular tissue.  Background parenchymal enhancement: Mild.  Right breast: No mass or abnormal enhancement. The previously identified biopsy-proven enhancing neoplasm in the right breast is not  seen today.  Left breast: No mass or abnormal enhancement.  Lymph nodes: No abnormal appearing lymph nodes.  Ancillary findings: None.  IMPRESSION: Known  cancer.  RECOMMENDATION: Treatment plan.  BI-RADS CATEGORY 6: Known biopsy-proven malignancy.   Electronically Signed By: Abelardo Diesel M.D. On: 07/02/2016 10:53.  The patient is a 50 year old female.   Allergies Elbert Ewings, [place];  11:29 AM) No Known Drug Allergies 01/26/2016  Medication History Elbert Ewings, CMA;  11:29 AM) Atenolol (25MG Tablet, Oral) Active. Multiple Vitamin (Oral) Active. Omega 3 (1000MG Capsule, Oral) Active. Medications Reconciled    Vitals Elbert Ewings CMA;  11:29 AM) 07/02/2016 11:29 AM Weight: 143.4 lb Height: 65in Body Surface Area: 1.72 m Body Mass Index: 23.86 kg/m  Temp.: 98.27F(Temporal)  Pulse: 72 (Regular)  BP: 124/76 (Sitting, Left Arm, Standard)      Physical Exam (Thomas A. Cornett MD;  12:06 PM)  General Note: Care loss noted  Head and Neck Head-normocephalic, atraumatic with no lesions or palpable masses. Trachea-midline. Thyroid Gland Characteristics - normal size and consistency.  Eye Eyeball - Bilateral-Extraocular movements intact. Sclera/Conjunctiva - Bilateral-No scleral icterus.  Chest and Lung Exam Chest and lung exam reveals -quiet, even and easy respiratory effort with no use of accessory muscles and on auscultation, normal breath sounds, no adventitious sounds and normal vocal resonance. Inspection Chest Wall - Normal. Back - normal.  Breast Note: Right breast is normal with no residual tumor palpated. Rule out insulinoma. Left breast normal. Left axilla normal.  Cardiovascular Cardiovascular examination reveals -normal heart sounds, regular rate and rhythm with no murmurs and normal pedal pulses bilaterally.  Musculoskeletal Normal Exam - Left-Upper Extremity Strength Normal and Lower Extremity Strength Normal. Normal Exam - Right-Upper Extremity Strength Normal and Lower Extremity Strength Normal.  Lymphatic Head & Neck  General  Head & Neck Lymphatics: Bilateral - Description - Normal. Axillary  General Axillary Region: Bilateral - Description - Normal. Tenderness - Non Tender.    Assessment & Plan (Thomas A. Cornett MD;  12:08 PM)  BREAST CANCER, RIGHT (C50.911) Impression: Discussed treatment options with the patient to include breast conservation versus mastectomy with reconstruction.   Lengthy discussion today with patient and husband. Patient initially wanted bilateral mastectomies and reconstruction. I talked with the pros and cons of this approach as well as breast conservation therapy and the pros and cons of this approach. I outlined recurrence rates for both as well as survival statistics for both. I outlined complications of both and long-term expectations and long-term expectations of both. I discussed the need for reconstruction and the patient is interested in doing that if she elects for bilateral mastectomy. I discussed nipple preservation in her case as well as no reconstruction if desired. She would like more time to think about her decision after today's meeting and we'll meet with the oncologist later on this week. She will call to set up surgery once she decides which option she would decide. She does have dense breast tissue making mammography less sensitive. She may be a candidate for biannual MRI given her dense breast tissue and history of breast cancer as well as failure MAMMOGRAPHIC diagnoses of her cancer. This does not necessitate bilateral mastectomies given complication rates are doubled in this circumstance.  Current Plans Pt Education - Pamphlet Given - Breast Biopsy: discussed with patient and provided information. We discussed the staging and pathophysiology of breast cancer. We discussed all of the different options for treatment for breast cancer including surgery, chemotherapy, radiation therapy, Herceptin,  and antiestrogen therapy. We discussed a sentinel lymph node biopsy as  she does not appear to having lymph node involvement right now. We discussed the performance of that with injection of radioactive tracer and blue dye. We discussed that she would have an incision underneath her axillary hairline. We discussed that there is a bout a 10-20% chance of having a positive node with a sentinel lymph node biopsy and we will await the permanent pathology to make any other first further decisions in terms of her treatment. One of these options might be to return to the operating room to perform an axillary lymph node dissection. We discussed about a 1-2% risk lifetime of chronic shoulder pain as well as lymphedema associated with a sentinel lymph node biopsy. We discussed the options for treatment of the breast cancer which included lumpectomy versus a mastectomy. We discussed the performance of the lumpectomy with a wire placement. We discussed a 10-20% chance of a positive margin requiring reexcision in the operating room. We also discussed that she may need radiation therapy or antiestrogen therapy or both if she undergoes lumpectomy. We discussed the mastectomy and the postoperative care for that as well. We discussed that there is no difference in her survival whether she undergoes lumpectomy with radiation therapy or antiestrogen therapy versus a mastectomy. There is a slight difference in the local recurrence rate being 3-5% with lumpectomy and about 1% with a mastectomy. We discussed the risks of operation including bleeding, infection, possible reoperation. She understands her further therapy will be based on what her stages at the time of her operation.  Pt Education - flb breast cancer surgery: discussed with patient and provided information. Pt Education - [facility] Mastectomy [facility]CI

## 2016-08-15 NOTE — Anesthesia Procedure Notes (Addendum)
Anesthesia Regional Block:  Pectoralis block  Pre-Anesthetic Checklist: ,, timeout performed, Correct Patient, Correct Site, Correct Laterality, Correct Procedure, Correct Position, site marked, Risks and benefits discussed, pre-op evaluation,  At surgeon's request and post-op pain management  Laterality: Right  Prep: Maximum Sterile Barrier Precautions used, chloraprep       Needles:  Injection technique: Single-shot  Needle Type: Echogenic Stimulator Needle     Needle Length: 9cm 9 cm Needle Gauge: 21 and 21 G    Additional Needles:  Procedures: ultrasound guided (picture in chart) Pectoralis block Narrative:  Start time: 08/15/2016 1:09 PM End time: 08/15/2016 1:19 PM Injection made incrementally with aspirations every 5 mL. Anesthesiologist: Roderic Palau  Additional Notes: 2% Lidocaine skin wheel.

## 2016-08-15 NOTE — Transfer of Care (Signed)
Immediate Anesthesia Transfer of Care Note  Patient: Elizabeth Cunningham  Procedure(s) Performed: Procedure(s) with comments: BREAST LUMPECTOMY WITH RADIOACTIVE SEED AND SENTINEL LYMPH NODE BIOPSY (Right) - BREAST LUMPECTOMY WITH RADIOACTIVE SEED AND SENTINEL LYMPH NODE BIOPSY  Patient Location: PACU  Anesthesia Type:General  Level of Consciousness: awake  Airway & Oxygen Therapy: Patient Spontanous Breathing and Patient connected to face mask oxygen  Post-op Assessment: Report given to RN  Post vital signs: Reviewed and stable  Last Vitals:  Vitals:   08/15/16 1520 08/15/16 1521  BP: 110/62   Pulse: 88 83  Resp:  10  Temp:      Last Pain:  Vitals:   08/15/16 1227  TempSrc: Oral      Patients Stated Pain Goal: 0 ([id number])  Complications: No apparent anesthesia complications

## 2016-08-15 NOTE — Anesthesia Postprocedure Evaluation (Signed)
Anesthesia Post Note  Patient: Elizabeth Cunningham  Procedure(s) Performed: Procedure(s) (LRB): BREAST LUMPECTOMY WITH RADIOACTIVE SEED AND SENTINEL LYMPH NODE BIOPSY (Right)  Patient location during evaluation: PACU Anesthesia Type: General Level of consciousness: awake and alert and oriented Pain management: pain level controlled Vital Signs Assessment: post-procedure vital signs reviewed and stable Respiratory status: spontaneous breathing, nonlabored ventilation and respiratory function stable Cardiovascular status: blood pressure returned to baseline and stable Postop Assessment: no signs of nausea or vomiting Anesthetic complications: no    Last Vitals:  Vitals:   08/15/16 1521 08/15/16 1530  BP:  (!) 113/53  Pulse: 83 79  Resp: 10 11  Temp: 36.5 C     Last Pain:  Vitals:   08/15/16 1521  TempSrc:   PainSc: 0-No pain                 Ellijah Leffel A.

## 2016-08-15 NOTE — Progress Notes (Signed)
Emotional support during breast injections °

## 2016-08-16 ENCOUNTER — Encounter (HOSPITAL_BASED_OUTPATIENT_CLINIC_OR_DEPARTMENT_OTHER): Payer: Self-pay | Admitting: Surgery

## 2016-08-23 NOTE — Assessment & Plan Note (Signed)
Right breast biopsy 01/20/2016 12:00: Invasive ductal carcinoma, grade 3, lymphovascular invasion is present, ER 0%, PR 0%, Ki-67 95%, HER-2 negative ratio 1.19; mamm and [place] irregular mass 2.9 x 1.9 x 1.6 cm, no axillary LN T2 N0 stage IIA clinical stage  Treatment Summary 1. Neoadjuvant chemotherapy with Adriamycin and Cytoxan dose dense 4 followed by Abraxane weekly 12  started 02/16/2016 completed 06/27/2016 2. Followed by Lumpectomy 08/14/16: Path CR 0/8 LN neg 3. Genetic counseling and testing on 03/26/2016: Normal; no mutations identified  Plan: 1. Adj XRT  RTC in 6 months for surveillance 2.

## 2016-08-24 ENCOUNTER — Ambulatory Visit (HOSPITAL_BASED_OUTPATIENT_CLINIC_OR_DEPARTMENT_OTHER): Payer: Managed Care, Other (non HMO) | Admitting: Hematology and Oncology

## 2016-08-24 ENCOUNTER — Encounter: Payer: Self-pay | Admitting: Hematology and Oncology

## 2016-08-24 DIAGNOSIS — Z171 Estrogen receptor negative status [ER-]: Secondary | ICD-10-CM

## 2016-08-24 DIAGNOSIS — C50211 Malignant neoplasm of upper-inner quadrant of right female breast: Secondary | ICD-10-CM

## 2016-08-24 NOTE — Progress Notes (Signed)
Patient Care Team: Vernie Shanks, MD as PCP - General (Family Medicine)  DIAGNOSIS:  Encounter Diagnosis  Name Primary?  . Malignant neoplasm of upper-inner quadrant of right breast in female, estrogen receptor negative ([place])     SUMMARY OF ONCOLOGIC HISTORY:   Breast cancer of upper-inner quadrant of right female breast ([place])   01/20/2016 Initial Diagnosis    Right breast biopsy 12:00: Invasive ductal carcinoma, grade 3, lymphovascular invasion is present, ER 0%, PR 0%, Ki-67 95%, HER-2 negative ratio 1.19; mamm and [place] irregular mass 2.9 x 1.9 x 1.6 cm, no axillary LN T2 N0 stage IIA clinical stage      02/16/2016 - 06/27/2016 Neo-Adjuvant Chemotherapy    Dose dense Adriamycin and Cytoxan 4 followed by Abraxane weekly 12      05/04/2016 Miscellaneous    Genetic testing was normal      06/29/2016 Breast MRI    Complete radiologic response      08/15/2016 Surgery    Rt Lumpectomy: No malignancy 0/8 LN neg, Path CR       CHIEF COMPLIANT: Follow-up after recent right lumpectomy  INTERVAL HISTORY: Elizabeth Cunningham is a 50 year old with above-mentioned history of right breast cancer treated with neoadjuvant chemotherapy. She underwent recent right lumpectomy and is here to discuss the results. She had a complete response to chemotherapy. She is recovering very well from surgery.  REVIEW OF SYSTEMS:   Constitutional: Denies fevers, chills or abnormal weight loss Eyes: Denies blurriness of vision Ears, nose, mouth, throat, and face: Denies mucositis or sore throat Respiratory: Denies cough, dyspnea or wheezes Cardiovascular: Denies palpitation, chest discomfort Gastrointestinal:  Denies nausea, heartburn or change in bowel habits Skin: Denies abnormal skin rashes Lymphatics: Denies new lymphadenopathy or easy bruising Neurological:Denies numbness, tingling or new weaknesses Behavioral/Psych: Mood is stable, no new changes  Extremities: No lower extremity edema Breast:  Right lumpectomy All other systems were reviewed with the patient and are negative.  I have reviewed the past medical history, past surgical history, social history and family history with the patient and they are unchanged from previous note.  ALLERGIES:  has No Known Allergies.  MEDICATIONS:  Current Outpatient Prescriptions  Medication Sig Dispense Refill  . atenolol (TENORMIN) 25 MG tablet Take 25 mg by mouth daily.    . Coenzyme Q10 (CO Q 10 PO) Take 1 capsule by mouth daily. Reported on 02/08/2016    . famotidine (PEPCID) 20 MG tablet Take 20 mg by mouth 2 (two) times daily.    . furosemide (LASIX) 20 MG tablet Take 1 tablet (20 mg total) by mouth daily as needed for edema. 30 tablet 0  . lidocaine-prilocaine (EMLA) cream Apply to affected area once (Patient not taking: Reported on 08/06/2016) 30 g 3  . LORazepam (ATIVAN) 0.5 MG tablet Take 1 tablet (0.5 mg total) by mouth at bedtime. (Patient not taking: Reported on 08/06/2016) 30 tablet 0  . methylPREDNISolone (MEDROL DOSEPAK) 4 MG TBPK tablet Medrol dose pak: take as directed. 21 tablet 0  . Multiple Vitamins-Minerals (MULTIVITAMIN WITH MINERALS) tablet Take 1 tablet by mouth daily.    . Omega 3 1000 MG CAPS Take 2 capsules by mouth daily.    . ondansetron (ZOFRAN) 8 MG tablet Take 1 tablet (8 mg total) by mouth 2 (two) times daily as needed. Start on the third day after chemotherapy. (Patient not taking: Reported on 08/06/2016) 30 tablet 1  . oxyCODONE-acetaminophen (ROXICET) 5-325 MG tablet Take 1 tablet by mouth every 4 (four)  hours as needed. 20 tablet 0  . potassium chloride SA (K-DUR,KLOR-CON) 20 MEQ tablet Take 1 tablet (20 mEq total) by mouth daily as needed (When taking Lasix). 30 tablet 0  . prochlorperazine (COMPAZINE) 10 MG tablet Take 1 tablet (10 mg total) by mouth every 6 (six) hours as needed (Nausea or vomiting). (Patient not taking: Reported on 08/06/2016) 30 tablet 1   No current facility-administered medications  for this visit.     PHYSICAL EXAMINATION: ECOG PERFORMANCE STATUS: 1 - Symptomatic but completely ambulatory  There were no vitals filed for this visit. There were no vitals filed for this visit.  GENERAL:alert, no distress and comfortable SKIN: skin color, texture, turgor are normal, no rashes or significant lesions EYES: normal, Conjunctiva are pink and non-injected, sclera clear OROPHARYNX:no exudate, no erythema and lips, buccal mucosa, and tongue normal  NECK: supple, thyroid normal size, non-tender, without nodularity LYMPH:  no palpable lymphadenopathy in the cervical, axillary or inguinal LUNGS: clear to auscultation and percussion with normal breathing effort HEART: regular rate & rhythm and no murmurs and no lower extremity edema ABDOMEN:abdomen soft, non-tender and normal bowel sounds MUSCULOSKELETAL:no cyanosis of digits and no clubbing  NEURO: alert & oriented x 3 with fluent speech, no focal motor/sensory deficits EXTREMITIES: No lower extremity edema  LABORATORY DATA:  I have reviewed the data as listed   Chemistry      Component Value Date/Time   NA 141 08/06/2016 1524   K 3.9 08/06/2016 1524   CL 102 02/03/2016 1129   CO2 27 08/06/2016 1524   BUN 10.7 08/06/2016 1524   CREATININE 0.7 08/06/2016 1524      Component Value Date/Time   CALCIUM 10.0 08/06/2016 1524   ALKPHOS 79 08/06/2016 1524   AST 15 08/06/2016 1524   ALT 20 08/06/2016 1524   BILITOT 0.56 08/06/2016 1524       Lab Results  Component Value Date   WBC 6.9 08/06/2016   HGB 12.6 08/06/2016   HCT 37.4 08/06/2016   MCV 94.1 08/06/2016   PLT 282 08/06/2016   NEUTROABS 4.2 08/06/2016    ASSESSMENT & PLAN:  Breast cancer of upper-inner quadrant of right female breast ([place]) Right breast biopsy 01/20/2016 12:00: Invasive ductal carcinoma, grade 3, lymphovascular invasion is present, ER 0%, PR 0%, Ki-67 95%, HER-2 negative ratio 1.19; mamm and [place] irregular mass 2.9 x 1.9 x 1.6 cm, no axillary  LN T2 N0 stage IIA clinical stage  Treatment Summary 1. Neoadjuvant chemotherapy with Adriamycin and Cytoxan dose dense 4 followed by Abraxane weekly 12  started 02/16/2016 completed 06/27/2016 2. Followed by Lumpectomy 08/14/16: Path CR 0/8 LN neg 3. Genetic counseling and testing on 03/26/2016: Normal; no mutations identified  Plan: Adj XRT Followed by surveillance  RTC in 6 months for surveillance  No orders of the defined types were placed in this encounter.  The patient has a good understanding of the overall plan. she agrees with it. she will call with any problems that may develop before the next visit here.   Rulon Eisenmenger, MD 08/24/16

## 2016-08-31 ENCOUNTER — Ambulatory Visit: Payer: Self-pay | Admitting: Surgery

## 2016-09-05 ENCOUNTER — Ambulatory Visit
Admission: RE | Admit: 2016-09-05 | Discharge: 2016-09-05 | Disposition: A | Discharge status: Home / Self Care | Payer: Managed Care, Other (non HMO) | Source: Ambulatory Visit | Attending: Radiation Oncology | Admitting: Radiation Oncology

## 2016-09-05 ENCOUNTER — Ambulatory Visit: Payer: Managed Care, Other (non HMO)

## 2016-09-05 DIAGNOSIS — Z510 Encounter for antineoplastic radiation therapy: Secondary | ICD-10-CM | POA: Insufficient documentation

## 2016-09-05 DIAGNOSIS — C50211 Malignant neoplasm of upper-inner quadrant of right female breast: Secondary | ICD-10-CM | POA: Insufficient documentation

## 2016-09-05 DIAGNOSIS — Z171 Estrogen receptor negative status [ER-]: Secondary | ICD-10-CM | POA: Insufficient documentation

## 2016-09-13 ENCOUNTER — Ambulatory Visit
Admission: RE | Admit: 2016-09-13 | Discharge: 2016-09-13 | Disposition: A | Discharge status: Home / Self Care | Payer: Managed Care, Other (non HMO) | Source: Ambulatory Visit | Attending: Radiation Oncology | Admitting: Radiation Oncology

## 2016-09-13 ENCOUNTER — Encounter: Payer: Self-pay | Admitting: Radiation Oncology

## 2016-09-13 VITALS — BP 151/87 | HR 95 | Temp 97.8°F | Resp 18 | Ht 65.0 in | Wt 156.0 lb

## 2016-09-13 DIAGNOSIS — C50211 Malignant neoplasm of upper-inner quadrant of right female breast: Secondary | ICD-10-CM

## 2016-09-13 DIAGNOSIS — Z171 Estrogen receptor negative status [ER-]: Principal | ICD-10-CM

## 2016-09-13 NOTE — Progress Notes (Signed)
Mrs. Emmaline 50 year old female is here for a FUN appointment S/P Right lumpectomy with radioactive seed and sential  lymph node biopsy 08-15-16.  Histology per Pathology Report: Diagnosis   08-15-16 1. Breast, lumpectomy, Right - FIBROSIS AND FOCAL CHRONIC INFLAMMATION. - NO RESIDUAL CARCINOMA. - FIBROCYSTIC CHANGES WITH CALCIFICATIONS. - BIOPSY CLIP. 2. Lymph node, sentinel, biopsy, Right Axillary - ONE BENIGN LYMPH NODE (0/1). 3. Lymph node, sentinel, biopsy, Right - ONE BENIGN LYMPH NODE (0/1). 4. Lymph node, sentinel, biopsy, Right - ONE BENIGN LYMPH NODE (0/1). 5. Lymph node, sentinel, biopsy, Right - ONE BENIGN LYMPH NODE (0/1) 6. Lymph node, sentinel, biopsy, Right - ONE BENIGN LYMPH NODE (0/1). 7. Lymph node, sentinel, biopsy, Right - ONE BENIGN LYMPH NODE (0/1). 8. Lymph node, sentinel, biopsy, Right - ONE BENIGN LYMPH NODE (0/1). 9. Lymph node, sentinel, biopsy, Right - ONE BENIGN LYMPH NODE (0/1). Receptor Status: ER(0), PR (0), Her2-neu (-), Ki-(95%)  Histology per Pathology Report: Diagnosis 01-20-16 Breast, right, needle core biopsy, 12:00 o'clock - INVASIVE DUCTAL CARCINOMA. Receptor Status: ER(0), PR (0), Her2-neu (-), Ki-(95%)   1. Neoadjuvant chemotherapy with Adriamycin and Cytoxan dose dense 4 followed by Abraxane weekly 12 started 02/16/2016 completed 06/27/2016 2. Followed by Lumpectomy 08/14/16: Path CR 0/8 LN neg Dr. [name] was seen 08/24/16:  Genetic counseling and testing 03/26/16=normal  No mutations identified, completed chemotherapy, follow up in 6 months,  For surveillance Pain:No Skin: Skin to right breast healing well slight redness to incision to axilla, sensitive to touch. Appetite is good, having fatigue all during the day. Getting port-A-cath removed September 27, 2016.   Saw Dr. Erroll Luna 08-31-16 for follow up Mrs. Mcquilkin reports she was healing well and doing well overall from her surgery. Wt Readings from Last 3 Encounters:   09/13/16 156 lb (70.8 kg)  08/24/16 148 lb 8 oz (67.4 kg)  08/15/16 146 lb (66.2 kg)  BP (!) 151/87   Pulse 95   Temp 97.8 F (36.6 C) (Oral)   Resp 18   Ht '5\' 5"'  (1.651 m)   Wt 156 lb (70.8 kg)   LMP 11/08/2015 Comment: chemo  SpO2 100%   BMI 25.96 kg/m

## 2016-09-13 NOTE — Progress Notes (Signed)
Radiation Oncology         [phone]) [phone] ________________________________  Name: Elizabeth Cunningham MRN: [id number]  Date: 09/13/2016  DOB: 1966/03/06  Follow-Up Visit Note  CC: Anthoney Harada, MD  Nicholas Lose, MD  Diagnosis:      ICD-9-CM ICD-10-CM   1. Malignant neoplasm of upper-inner quadrant of right breast in female, estrogen receptor negative ([place]) 174.2 C50.211    V86.1 Z17.1     Clinical stage IIA (T2, N0) grade 3 invasive ductal carcinoma of the right breast (triple negative); (ypT0, ypN0)  Narrative:  The patient returns today to further discuss adjuvant radiation therapy.    Breast cancer of upper-inner quadrant of right female breast ([place])   01/20/2016 Initial Diagnosis    Right breast biopsy 12:00: Invasive ductal carcinoma, grade 3, lymphovascular invasion is present, ER 0%, PR 0%, Ki-67 95%, HER-2 negative ratio 1.19; mamm and [place] irregular mass 2.9 x 1.9 x 1.6 cm, no axillary LN T2 N0 stage IIA clinical stage      02/16/2016 - 06/27/2016 Neo-Adjuvant Chemotherapy    Dose dense Adriamycin and Cytoxan 4 followed by Abraxane weekly 12      05/04/2016 Miscellaneous    Genetic testing was normal      06/29/2016 Breast MRI    Complete radiologic response      08/15/2016 Surgery    Rt Lumpectomy: No malignancy 0/8 LN neg, Path CR      The patient's initial consultation with radiation oncology was with Dr. Pablo Ledger on 02/08/16. Genetic counseling and testing on 03/26/16 revealed no mutations.  The patient completed neoadjuvant chemotherapy consisting of dose dense Adriamycin and Cytoxan x4 followed by Abraxane weekly x12 on 06/27/16. MRI of the bilateral breasts on 06/29/16 showed a completed radiologic response.  The patient had surgery on 08/15/16 by Dr. Brantley Stage consisting of a right lumpectomy and sentinel lymph node biopsy. Lumpectomy revealed fibrosis, focal chronic inflammation, fibrocystic changes with calcifications, and no residual carcinoma.  Biopsy of 8 right axillary sentinel lymph nodes were negative.  On review of systems, the patient denies pain, has a good appetite, and reports fatigue.                              ALLERGIES:  has No Known Allergies.  Meds: Current Outpatient Prescriptions  Medication Sig Dispense Refill  . atenolol (TENORMIN) 25 MG tablet Take 25 mg by mouth daily.    . Coenzyme Q10 (CO Q 10 PO) Take 1 capsule by mouth daily. Reported on 02/08/2016    . Multiple Vitamins-Minerals (MULTIVITAMIN WITH MINERALS) tablet Take 1 tablet by mouth daily.    . Omega 3 1000 MG CAPS Take 2 capsules by mouth daily.    . famotidine (PEPCID) 20 MG tablet Take 20 mg by mouth 2 (two) times daily.    . furosemide (LASIX) 20 MG tablet Take 1 tablet (20 mg total) by mouth daily as needed for edema. (Patient not taking: Reported on 09/13/2016) 30 tablet 0  . lidocaine-prilocaine (EMLA) cream Apply to affected area once (Patient not taking: Reported on 09/13/2016) 30 g 3  . LORazepam (ATIVAN) 0.5 MG tablet Take 1 tablet (0.5 mg total) by mouth at bedtime. (Patient not taking: Reported on 09/13/2016) 30 tablet 0  . ondansetron (ZOFRAN) 8 MG tablet Take 1 tablet (8 mg total) by mouth 2 (two) times daily as needed. Start on the third day after chemotherapy. (Patient not taking: Reported on 09/13/2016) 30 tablet  1  . potassium chloride SA (K-DUR,KLOR-CON) 20 MEQ tablet Take 1 tablet (20 mEq total) by mouth daily as needed (When taking Lasix). (Patient not taking: Reported on 09/13/2016) 30 tablet 0  . prochlorperazine (COMPAZINE) 10 MG tablet Take 1 tablet (10 mg total) by mouth every 6 (six) hours as needed (Nausea or vomiting). (Patient not taking: Reported on 09/13/2016) 30 tablet 1   No current facility-administered medications for this encounter.     Physical Findings: The patient is in no acute distress. Patient is alert and oriented.  height is '5\' 5"'  (1.651 m) and weight is 156 lb (70.8 kg). Her oral temperature is 97.8 F  (36.6 C). Her blood pressure is 151/87 (abnormal) and her pulse is 95. Her respiration is 18 and oxygen saturation is 100%. . General: Well-developed, in no acute distress HEENT: Normocephalic, atraumatic Cardiovascular: Regular rate and rhythm Respiratory: Clear to auscultation bilaterally GI: Soft, nontender, normal bowel sounds Extremities: No edema present Breast: Deferred during this encounter.  Lab Findings: Lab Results  Component Value Date   WBC 6.9 08/06/2016   HGB 12.6 08/06/2016   HCT 37.4 08/06/2016   MCV 94.1 08/06/2016   PLT 282 08/06/2016     Radiographic Findings: Mm Breast Surgical Specimen  Result Date: 08/15/2016 CLINICAL DATA:  Status post right lumpectomy. EXAM: SPECIMEN RADIOGRAPH OF THE RIGHT BREAST COMPARISON:  Previous exam(s). FINDINGS: Status post excision of the right breast. The radioactive seed and biopsy marker clip are present, completely intact, and were marked for pathology. IMPRESSION: Specimen radiograph of the right breast. Electronically Signed   By: Fidela Salisbury M.D.   On: 08/15/2016 14:44    Impression: Clinical stage IIA (T2, N0) grade 3 invasive ductal carcinoma of the right breast (triple negative); (ypT0, ypN0)  The patient has completed surgery and neoadjuvant chemotherapy. The patient had a pathologic complete response and we discussed today the positive prognostic aspect of this. She is suitable to proceed with adjuvant radiotherapy at this time.  I discussed the rationale for radiation treatment in this setting. We discussed the potential benefit of radiation treatment as well as the possible side effects and risks. All of her questions were answered.  The patient wishes to proceed with radiation treatment.  Plan:  The patient will be scheduled for a simulation in the near future such that we can proceed with radiation treatment planning. The patient signed a consent form and a copy was placed in her medical chart. I  anticipate treating the patient to the right breast for approximately 6 and one half weeks.  ------------------------------------------------  Jodelle Gross, MD, PhD  This document serves as a record of services personally performed by Kyung Rudd, MD. It was created on his behalf by Darcus Austin, a trained medical scribe. The creation of this record is based on the scribe's personal observations and the provider's statements to them. This document has been checked and approved by the attending provider.

## 2016-09-19 ENCOUNTER — Ambulatory Visit
Admission: RE | Admit: 2016-09-19 | Discharge: 2016-09-19 | Disposition: A | Discharge status: Home / Self Care | Payer: Managed Care, Other (non HMO) | Source: Ambulatory Visit | Attending: Radiation Oncology | Admitting: Radiation Oncology

## 2016-09-19 DIAGNOSIS — Z171 Estrogen receptor negative status [ER-]: Principal | ICD-10-CM

## 2016-09-19 DIAGNOSIS — C50211 Malignant neoplasm of upper-inner quadrant of right female breast: Secondary | ICD-10-CM | POA: Diagnosis present

## 2016-09-19 DIAGNOSIS — Z510 Encounter for antineoplastic radiation therapy: Secondary | ICD-10-CM | POA: Diagnosis present

## 2016-09-20 NOTE — Progress Notes (Signed)
  Radiation Oncology         [phone]) [phone] ________________________________  Name: Elizabeth Cunningham MRN: [id number]  Date: 09/19/2016  DOB: 09/06/1966  Optical Surface Tracking Plan:  Since intensity modulated radiotherapy (IMRT) and 3D conformal radiation treatment methods are predicated on accurate and precise positioning for treatment, intrafraction motion monitoring is medically necessary to ensure accurate and safe treatment delivery.  The ability to quantify intrafraction motion without excessive ionizing radiation dose can only be performed with optical surface tracking. Accordingly, surface imaging offers the opportunity to obtain 3D measurements of patient position throughout IMRT and 3D treatments without excessive radiation exposure.  I am ordering optical surface tracking for this patient's upcoming course of radiotherapy. ________________________________  Kyung Rudd, MD 09/20/2016 10:37 AM    Reference:   Ursula Alert, J, et al. Surface imaging-based analysis of intrafraction motion for breast radiotherapy patients.Journal of [place], n. 6, nov. 2014. ISSN [id number].   Available at: <http://www.jacmp.org/index.php/jacmp/article/view/4957>.

## 2016-09-20 NOTE — Progress Notes (Signed)
  Radiation Oncology         [phone] ________________________________  Name: Taccara Wiltz MRN: [id number]  Date: 09/19/2016  DOB: 11/25/1965  DIAGNOSIS:     ICD-9-CM ICD-10-CM   1. Malignant neoplasm of upper-inner quadrant of right breast in female, estrogen receptor negative ([facility]) 174.2 C50.211    V86.1 Z17.1      SIMULATION AND TREATMENT PLANNING NOTE  The patient presented for simulation prior to beginning her course of radiation treatment for her diagnosis of right-sided breast cancer. The patient was placed in a supine position on a breast board. A customized vac-lock bag was constructed and this complex treatment device will be used on a daily basis during her treatment. In this fashion, a CT scan was obtained through the chest area and an isocenter was placed near the chest wall within the breast.  The patient will be planned to receive a course of radiation initially to a dose of 50.4 Gy. This will consist of a whole breast radiotherapy technique. To accomplish this, 2 customized blocks have been designed which will correspond to medial and lateral whole breast tangent fields. This treatment will be accomplished at 1.8 Gy per fraction. A forward planning technique will also be evaluated to determine if this approach improves the plan. It is anticipated that the patient will then receive a 10 Gy boost to the seroma cavity which has been contoured. This will be accomplished at 2 Gy per fraction.   This initial treatment will consist of a 3-D conformal technique. The seroma has been contoured as the primary target structure. Additionally, dose volume histograms of both this target as well as the lungs and heart will also be evaluated. Such an approach is necessary to ensure that the target area is adequately covered while the nearby critical  normal structures are adequately spared.  Plan:  The final anticipated total dose therefore will correspond to 60.4  Gy.    _______________________________   Jodelle Gross, MD, PhD

## 2016-09-25 ENCOUNTER — Encounter (HOSPITAL_COMMUNITY)
Admission: RE | Admit: 2016-09-25 | Discharge: 2016-09-25 | Disposition: A | Discharge status: Home / Self Care | Payer: Managed Care, Other (non HMO) | Source: Ambulatory Visit | Attending: Surgery | Admitting: Surgery

## 2016-09-25 ENCOUNTER — Other Ambulatory Visit (HOSPITAL_COMMUNITY): Payer: Self-pay | Admitting: *Deleted

## 2016-09-25 ENCOUNTER — Encounter (HOSPITAL_COMMUNITY): Payer: Self-pay

## 2016-09-25 DIAGNOSIS — Z79899 Other long term (current) drug therapy: Secondary | ICD-10-CM | POA: Diagnosis not present

## 2016-09-25 DIAGNOSIS — I10 Essential (primary) hypertension: Secondary | ICD-10-CM | POA: Diagnosis not present

## 2016-09-25 DIAGNOSIS — Z452 Encounter for adjustment and management of vascular access device: Secondary | ICD-10-CM | POA: Diagnosis present

## 2016-09-25 DIAGNOSIS — Z853 Personal history of malignant neoplasm of breast: Secondary | ICD-10-CM | POA: Diagnosis not present

## 2016-09-25 DIAGNOSIS — Z9221 Personal history of antineoplastic chemotherapy: Secondary | ICD-10-CM | POA: Diagnosis not present

## 2016-09-25 LAB — CBC
HEMATOCRIT: 37.5 % (ref 36.0–46.0)
HEMOGLOBIN: 13 g/dL (ref 12.0–15.0)
MCH: 31.3 pg (ref 26.0–34.0)
MCHC: 34.7 g/dL (ref 30.0–36.0)
MCV: 90.4 fL (ref 78.0–100.0)
Platelets: 294 10*3/uL (ref 150–400)
RBC: 4.15 MIL/uL (ref 3.87–5.11)
RDW: 12.8 % (ref 11.5–15.5)
WBC: 6.3 10*3/uL (ref 4.0–10.5)

## 2016-09-25 LAB — BASIC METABOLIC PANEL
ANION GAP: 12 (ref 5–15)
BUN: 10 mg/dL (ref 6–20)
CO2: 27 mmol/L (ref 22–32)
Calcium: 10.2 mg/dL (ref 8.9–10.3)
Chloride: 99 mmol/L — ABNORMAL LOW (ref 101–111)
Creatinine, Ser: 0.65 mg/dL (ref 0.44–1.00)
GFR calc Af Amer: >60 mL/min (ref >60)
GLUCOSE: 94 mg/dL (ref 65–99)
POTASSIUM: 3.8 mmol/L (ref 3.5–5.1)
Sodium: 138 mmol/L (ref 135–145)

## 2016-09-25 NOTE — Pre-Procedure Instructions (Addendum)
Elizabeth Cunningham  09/25/2016     Your procedure is scheduled on Thursday, September 27, 2016 at 8:50 AM.   Report to [facility] Entrance "A" Admitting Office  at 6:50 AM.   Call this number if you have problems the morning of surgery:[phone]   Questions prior to day of surgery, please call [phone] between 8 & 4 PM.   Remember:  Do not eat food or drink liquids after midnight Wednesday, 09/26/16.  Take these medicines the morning of surgery with A SIP OF WATER: Atenolol (Tenormin)  Stop Multivitamins, CoQ10, Fish Oil, NSAIDS (Ibuprofen, Aleve, etc.) and Multivitamins as of today.  Drink "Boost" 2 hours prior to arrival day of surgery.   Do not wear jewelry, make-up or nail polish.  Do not wear lotions, powders, or perfumes.  Do not shave 48 hours prior to surgery.    Do not bring valuables to the hospital.  [facility] is not responsible for any belongings or valuables.  Contacts, dentures or bridgework may not be worn into surgery.  Leave your suitcase in the car.  After surgery it may be brought to your room.  For patients admitted to the hospital, discharge time will be determined by your treatment team.  Patients discharged the day of surgery will not be allowed to drive home.   Special instructions:  [employer] - Preparing for Surgery  Before surgery, you can play an important role.  Because skin is not sterile, your skin needs to be as free of germs as possible.  You can reduce the number of germs on you skin by washing with CHG (chlorahexidine gluconate) soap before surgery.  CHG is an antiseptic cleaner which kills germs and bonds with the skin to continue killing germs even after washing.  Please DO NOT use if you have an allergy to CHG or antibacterial soaps.  If your skin becomes reddened/irritated stop using the CHG and inform your nurse when you arrive at Short Stay.  Do not shave (including legs and underarms) for at least 48 hours prior to  the first CHG shower.  You may shave your face.  Please follow these instructions carefully:   1.  Shower with CHG Soap the night before surgery and the                    morning of Surgery.  2.  If you choose to wash your hair, wash your hair first as usual with your       normal shampoo.  3.  After you shampoo, rinse your hair and body thoroughly to remove the shampoo.  4.  Use CHG as you would any other liquid soap.  You can apply chg directly       to the skin and wash gently with scrungie or a clean washcloth.  5.  Apply the CHG Soap to your body ONLY FROM THE NECK DOWN.        Do not use on open wounds or open sores.  Avoid contact with your eyes, ears, mouth and genitals (private parts).  Wash genitals (private parts) with your normal soap.  6.  Wash thoroughly, paying special attention to the area where your surgery        will be performed.  7.  Thoroughly rinse your body with warm water from the neck down.  8.  DO NOT shower/wash with your normal soap after using and rinsing off       the  CHG Soap.  9.  Pat yourself dry with a clean towel.            10.  Wear clean pajamas.            11.  Place clean sheets on your bed the night of your first shower and do not        sleep with pets.  Day of Surgery  Do not apply any lotions the morning of surgery.  Please wear clean clothes to the hospital.   Please read over the fact sheets that you were given.

## 2016-09-26 ENCOUNTER — Ambulatory Visit
Admission: RE | Admit: 2016-09-26 | Discharge: 2016-09-26 | Disposition: A | Discharge status: Home / Self Care | Payer: Managed Care, Other (non HMO) | Source: Ambulatory Visit | Attending: Radiation Oncology | Admitting: Radiation Oncology

## 2016-09-26 DIAGNOSIS — Z170 Estrogen receptor positive status [ER+]: Principal | ICD-10-CM

## 2016-09-26 DIAGNOSIS — Z510 Encounter for antineoplastic radiation therapy: Secondary | ICD-10-CM | POA: Diagnosis not present

## 2016-09-26 DIAGNOSIS — C50211 Malignant neoplasm of upper-inner quadrant of right female breast: Secondary | ICD-10-CM

## 2016-09-26 MED ORDER — ALRA NON-METALLIC DEODORANT (RAD-ONC)
1.0000 "application " | Freq: Once | TOPICAL | Status: AC
  Administered 2016-09-26: 1 via TOPICAL

## 2016-09-26 MED ORDER — RADIAPLEXRX EX GEL
Freq: Once | CUTANEOUS | Status: AC
  Administered 2016-09-26: 16:00:00 via TOPICAL

## 2016-09-26 MED ORDER — DEXTROSE 5 % IV SOLN
3.0000 g | INTRAVENOUS | Status: AC
  Administered 2016-09-27: 3 g via INTRAVENOUS
  Filled 2016-09-26: qty 3000

## 2016-09-26 NOTE — Progress Notes (Signed)
Patient education done, breast,  radiation therapy and you book, my business card, alra deodorant,radiaplex cream given topatient,discussed ways to manage side effects, skin irritation, pain, fatigue, use of luke warm showrms, electric razors only, dove unscented soap, no under wire bras, increas protein in diet, stay hydrated, drink plenty water, pat dry , use radiaplex after treatment and bedtime daily, alra after tx and prn, sees MD weekly and prn, verbal uinderstanding, teach back given 3:53 PM

## 2016-09-27 ENCOUNTER — Ambulatory Visit
Admission: RE | Admit: 2016-09-27 | Discharge: 2016-09-27 | Disposition: A | Discharge status: Home / Self Care | Payer: Managed Care, Other (non HMO) | Source: Ambulatory Visit | Attending: Radiation Oncology | Admitting: Radiation Oncology

## 2016-09-27 ENCOUNTER — Ambulatory Visit (HOSPITAL_COMMUNITY): Payer: Managed Care, Other (non HMO) | Admitting: Anesthesiology

## 2016-09-27 ENCOUNTER — Ambulatory Visit (HOSPITAL_COMMUNITY)
Admission: RE | Admit: 2016-09-27 | Discharge: 2016-09-27 | Disposition: A | Discharge status: Home / Self Care | Payer: Managed Care, Other (non HMO) | Source: Ambulatory Visit | Attending: Surgery | Admitting: Surgery

## 2016-09-27 ENCOUNTER — Encounter (HOSPITAL_COMMUNITY)
Admission: RE | Disposition: A | Discharge status: Home / Self Care | Payer: Self-pay | Source: Ambulatory Visit | Attending: Surgery

## 2016-09-27 ENCOUNTER — Encounter (HOSPITAL_COMMUNITY): Payer: Self-pay | Admitting: *Deleted

## 2016-09-27 ENCOUNTER — Ambulatory Visit: Payer: Managed Care, Other (non HMO)

## 2016-09-27 DIAGNOSIS — Z79899 Other long term (current) drug therapy: Secondary | ICD-10-CM | POA: Insufficient documentation

## 2016-09-27 DIAGNOSIS — I10 Essential (primary) hypertension: Secondary | ICD-10-CM | POA: Insufficient documentation

## 2016-09-27 DIAGNOSIS — Z853 Personal history of malignant neoplasm of breast: Secondary | ICD-10-CM | POA: Insufficient documentation

## 2016-09-27 DIAGNOSIS — Z452 Encounter for adjustment and management of vascular access device: Secondary | ICD-10-CM | POA: Diagnosis not present

## 2016-09-27 DIAGNOSIS — Z510 Encounter for antineoplastic radiation therapy: Secondary | ICD-10-CM | POA: Diagnosis not present

## 2016-09-27 DIAGNOSIS — Z9221 Personal history of antineoplastic chemotherapy: Secondary | ICD-10-CM | POA: Insufficient documentation

## 2016-09-27 HISTORY — PX: PORT-A-CATH REMOVAL: SHX5289

## 2016-09-27 SURGERY — REMOVAL PORT-A-CATH
Anesthesia: Monitor Anesthesia Care | Site: Chest

## 2016-09-27 MED ORDER — DEXAMETHASONE SODIUM PHOSPHATE 10 MG/ML IJ SOLN
INTRAMUSCULAR | Status: AC
  Filled 2016-09-27: qty 1

## 2016-09-27 MED ORDER — PROPOFOL 10 MG/ML IV BOLUS
INTRAVENOUS | Status: AC
  Filled 2016-09-27: qty 20

## 2016-09-27 MED ORDER — ONDANSETRON HCL 4 MG/2ML IJ SOLN
INTRAMUSCULAR | Status: DC | PRN
  Administered 2016-09-27: 4 mg via INTRAVENOUS

## 2016-09-27 MED ORDER — FENTANYL CITRATE (PF) 100 MCG/2ML IJ SOLN
INTRAMUSCULAR | Status: DC | PRN
  Administered 2016-09-27: 50 ug via INTRAVENOUS

## 2016-09-27 MED ORDER — BUPIVACAINE HCL (PF) 0.25 % IJ SOLN
INTRAMUSCULAR | Status: DC | PRN
  Administered 2016-09-27: 10 mL

## 2016-09-27 MED ORDER — CHLORHEXIDINE GLUCONATE CLOTH 2 % EX PADS: 6.0000 | MEDICATED_PAD | Freq: Once | CUTANEOUS | Status: DC

## 2016-09-27 MED ORDER — HYDROMORPHONE HCL 1 MG/ML IJ SOLN: 0.2500 mg | INTRAMUSCULAR | Status: DC | PRN

## 2016-09-27 MED ORDER — LIDOCAINE HCL (PF) 1 % IJ SOLN
INTRAMUSCULAR | Status: AC
Start: 2016-09-27 — End: 2016-09-27
  Filled 2016-09-27: qty 30

## 2016-09-27 MED ORDER — 0.9 % SODIUM CHLORIDE (POUR BTL) OPTIME
TOPICAL | Status: DC | PRN
  Administered 2016-09-27: 1000 mL

## 2016-09-27 MED ORDER — PHENYLEPHRINE 40 MCG/ML (10ML) SYRINGE FOR IV PUSH (FOR BLOOD PRESSURE SUPPORT)
PREFILLED_SYRINGE | INTRAVENOUS | Status: AC
  Filled 2016-09-27: qty 10

## 2016-09-27 MED ORDER — CELECOXIB 200 MG PO CAPS
400.0000 mg | ORAL_CAPSULE | ORAL | Status: AC
  Administered 2016-09-27: 400 mg via ORAL
  Filled 2016-09-27: qty 2

## 2016-09-27 MED ORDER — ROCURONIUM BROMIDE 50 MG/5ML IV SOSY
PREFILLED_SYRINGE | INTRAVENOUS | Status: AC
  Filled 2016-09-27: qty 5

## 2016-09-27 MED ORDER — SUCCINYLCHOLINE CHLORIDE 200 MG/10ML IV SOSY
PREFILLED_SYRINGE | INTRAVENOUS | Status: AC
  Filled 2016-09-27: qty 10

## 2016-09-27 MED ORDER — BUPIVACAINE HCL (PF) 0.25 % IJ SOLN
INTRAMUSCULAR | Status: AC
  Filled 2016-09-27: qty 30

## 2016-09-27 MED ORDER — ACETAMINOPHEN 500 MG PO TABS
1000.0000 mg | ORAL_TABLET | ORAL | Status: AC
  Administered 2016-09-27: 1000 mg via ORAL
  Filled 2016-09-27: qty 2

## 2016-09-27 MED ORDER — EPHEDRINE 5 MG/ML INJ
INTRAVENOUS | Status: AC
  Filled 2016-09-27: qty 10

## 2016-09-27 MED ORDER — MIDAZOLAM HCL 2 MG/2ML IJ SOLN
INTRAMUSCULAR | Status: AC
  Filled 2016-09-27: qty 2

## 2016-09-27 MED ORDER — MIDAZOLAM HCL 5 MG/5ML IJ SOLN
INTRAMUSCULAR | Status: DC | PRN
  Administered 2016-09-27: 2 mg via INTRAVENOUS

## 2016-09-27 MED ORDER — GABAPENTIN 300 MG PO CAPS
300.0000 mg | ORAL_CAPSULE | ORAL | Status: AC
Start: 2016-09-27 — End: 2016-09-27
  Administered 2016-09-27: 300 mg via ORAL
  Filled 2016-09-27: qty 1

## 2016-09-27 MED ORDER — LIDOCAINE 2% (20 MG/ML) 5 ML SYRINGE
INTRAMUSCULAR | Status: AC
  Filled 2016-09-27: qty 5

## 2016-09-27 MED ORDER — FENTANYL CITRATE (PF) 100 MCG/2ML IJ SOLN
INTRAMUSCULAR | Status: AC
  Filled 2016-09-27: qty 2

## 2016-09-27 MED ORDER — LACTATED RINGERS IV SOLN
INTRAVENOUS | Status: DC
  Administered 2016-09-27: 08:00:00 via INTRAVENOUS

## 2016-09-27 MED ORDER — LIDOCAINE-EPINEPHRINE (PF) 1 %-1:200000 IJ SOLN
INTRAMUSCULAR | Status: AC
  Filled 2016-09-27: qty 30

## 2016-09-27 MED ORDER — PROMETHAZINE HCL 25 MG/ML IJ SOLN: 6.2500 mg | INTRAMUSCULAR | Status: DC | PRN

## 2016-09-27 MED ORDER — ONDANSETRON HCL 4 MG/2ML IJ SOLN
INTRAMUSCULAR | Status: AC
  Filled 2016-09-27: qty 6

## 2016-09-27 MED ORDER — PROPOFOL 500 MG/50ML IV EMUL
INTRAVENOUS | Status: DC | PRN
  Administered 2016-09-27: 100 ug/kg/min via INTRAVENOUS

## 2016-09-27 SURGICAL SUPPLY — 36 items
BLADE SURG 15 STRL LF DISP TIS (BLADE) ×1 IMPLANT
BLADE SURG 15 STRL SS (BLADE) ×1
CHLORAPREP W/TINT 10.5 ML (MISCELLANEOUS) ×2 IMPLANT
COVER SURGICAL LIGHT HANDLE (MISCELLANEOUS) ×2 IMPLANT
DECANTER SPIKE VIAL GLASS SM (MISCELLANEOUS) ×2 IMPLANT
DERMABOND ADVANCED (GAUZE/BANDAGES/DRESSINGS) ×1
DERMABOND ADVANCED .7 DNX12 (GAUZE/BANDAGES/DRESSINGS) ×1 IMPLANT
DRAPE LAPAROTOMY 100X72 PEDS (DRAPES) ×2 IMPLANT
DRAPE UTILITY XL STRL (DRAPES) ×2 IMPLANT
ELECT CAUTERY BLADE 6.4 (BLADE) ×2 IMPLANT
ELECT REM PT RETURN 9FT ADLT (ELECTROSURGICAL) ×2
ELECTRODE REM PT RTRN 9FT ADLT (ELECTROSURGICAL) ×1 IMPLANT
GAUZE SPONGE 4X4 16PLY XRAY LF (GAUZE/BANDAGES/DRESSINGS) ×2 IMPLANT
GLOVE BIO SURGEON STRL SZ7 (GLOVE) ×2 IMPLANT
GLOVE BIO SURGEON STRL SZ8 (GLOVE) ×2 IMPLANT
GLOVE BIOGEL PI IND STRL 7.0 (GLOVE) ×1 IMPLANT
GLOVE BIOGEL PI IND STRL 8 (GLOVE) ×1 IMPLANT
GLOVE BIOGEL PI INDICATOR 7.0 (GLOVE) ×1
GLOVE BIOGEL PI INDICATOR 8 (GLOVE) ×1
GOWN STRL REUS W/ TWL LRG LVL3 (GOWN DISPOSABLE) ×1 IMPLANT
GOWN STRL REUS W/ TWL XL LVL3 (GOWN DISPOSABLE) ×1 IMPLANT
GOWN STRL REUS W/TWL LRG LVL3 (GOWN DISPOSABLE) ×1
GOWN STRL REUS W/TWL XL LVL3 (GOWN DISPOSABLE) ×1
KIT BASIN OR (CUSTOM PROCEDURE TRAY) ×2 IMPLANT
KIT ROOM TURNOVER OR (KITS) ×2 IMPLANT
NEEDLE HYPO 25GX1X1/2 BEV (NEEDLE) ×2 IMPLANT
NS IRRIG 1000ML POUR BTL (IV SOLUTION) ×2 IMPLANT
PACK SURGICAL SETUP 50X90 (CUSTOM PROCEDURE TRAY) ×2 IMPLANT
PAD ARMBOARD 7.5X6 YLW CONV (MISCELLANEOUS) ×4 IMPLANT
PENCIL BUTTON HOLSTER BLD 10FT (ELECTRODE) ×2 IMPLANT
SUT MNCRL AB 4-0 PS2 18 (SUTURE) ×2 IMPLANT
SUT VIC AB 3-0 SH 27 (SUTURE) ×1
SUT VIC AB 3-0 SH 27X BRD (SUTURE) ×1 IMPLANT
SYR CONTROL 10ML LL (SYRINGE) ×2 IMPLANT
TOWEL OR 17X24 6PK STRL BLUE (TOWEL DISPOSABLE) ×2 IMPLANT
TOWEL OR 17X26 10 PK STRL BLUE (TOWEL DISPOSABLE) IMPLANT

## 2016-09-27 NOTE — Op Note (Signed)
Preop diagnosis: Indwelling port a catheter for chemotherapy  Postop diagnosis: Same  Procedure: Removal of port a catheter  Surgeon: Erroll Luna M.D.  Anesthesia: MAC with local  EBL: Minimal  Specimen none  Drains: None  Indications for procedure: The patient presents for removal of port a catheter after completing chemotherapy. The patient no longer requires central venous access. Risks of bleeding, infection, catheter fragmentation, embolization, arrhythmias and damage to arteries, veins and nerves and possibly other mediastinal structures discussed. The patient agrees to proceed.  Description of procedure: The patient was seen in the holding area. Questions were answered. The patient agreed to proceed. The patient was taken to the operating room. The patient was placed supine. Anesthesia was initiated. The skin on the left upper chest was prepped and draped in a sterile fashion. Timeout was done. The patient received preoperative antibiotics. Incision was made through the old port site and the hub of the Port-A-Cath was seen. The sutures were cut to release the port from the chest wall. The catheter was removed in its entirety without difficulty. The tract was closed with 3-0 Vicryl. 4-0 Monocryl was used to close the skin. All final counts were correct. The patient was taken to recovery in satisfactory condition.

## 2016-09-27 NOTE — Anesthesia Postprocedure Evaluation (Addendum)
Anesthesia Post Note  Patient: Elizabeth Cunningham  Procedure(s) Performed: Procedure(s) (LRB): REMOVAL PORT-A-CATH (N/A)  Patient location during evaluation: PACU Anesthesia Type: MAC Level of consciousness: awake and alert Pain management: pain level controlled Vital Signs Assessment: post-procedure vital signs reviewed and stable Respiratory status: spontaneous breathing, nonlabored ventilation, respiratory function stable and patient connected to nasal cannula oxygen Cardiovascular status: stable and blood pressure returned to baseline Anesthetic complications: no       Last Vitals:  Vitals:   09/27/16 0948 09/27/16 0949  BP:  105/64  Pulse:  66  Resp:  11  Temp: 36.1 C     Last Pain:  Vitals:   09/27/16 0948  TempSrc:   PainSc: 0-No pain                 Kimie Pidcock,JAMES TERRILL

## 2016-09-27 NOTE — Discharge Instructions (Signed)
Ok TO SHOWER NO RESTRICTIONS   GLUE WILL FALL OFF

## 2016-09-27 NOTE — Anesthesia Procedure Notes (Signed)
Procedure Name: MAC Date/Time: 09/27/2016 8:42 AM Performed by: Babs Bertin Pre-anesthesia Checklist: Patient identified, Emergency Drugs available, Suction available, Patient being monitored and Timeout performed Patient Re-evaluated:Patient Re-evaluated prior to inductionOxygen Delivery Method: Nasal cannula

## 2016-09-27 NOTE — Anesthesia Preprocedure Evaluation (Addendum)
Anesthesia Evaluation  Patient identified by MRN, date of birth, ID band Patient awake  General Assessment Comment:Breast ca rx  Reviewed: Allergy & Precautions, Patient's Chart, lab work & pertinent test results  Airway Mallampati: II  TM Distance: >3 FB Neck ROM: Full    Dental  (+) Teeth Intact   Pulmonary    breath sounds clear to auscultation       Cardiovascular hypertension, negative cardio ROS   Rhythm:Regular Rate:Normal     Neuro/Psych negative neurological ROS     GI/Hepatic Neg liver ROS,   Endo/Other  negative endocrine ROS  Renal/GU negative Renal ROS     Musculoskeletal   Abdominal   Peds  Hematology negative hematology ROS (+)   Anesthesia Other Findings   Reproductive/Obstetrics                            Anesthesia Physical Anesthesia Plan  ASA: II  Anesthesia Plan: MAC   Post-op Pain Management:    Induction: Intravenous  Airway Management Planned: Simple Face Mask  Additional Equipment:   Intra-op Plan:   Post-operative Plan:   Informed Consent: I have reviewed the patients History and Physical, chart, labs and discussed the procedure including the risks, benefits and alternatives for the proposed anesthesia with the patient or authorized representative who has indicated his/her understanding and acceptance.   Dental advisory given  Plan Discussed with: CRNA  Anesthesia Plan Comments:         Anesthesia Quick Evaluation

## 2016-09-27 NOTE — Transfer of Care (Signed)
Immediate Anesthesia Transfer of Care Note  Patient: Elizabeth Cunningham  Procedure(s) Performed: Procedure(s): REMOVAL PORT-A-CATH (N/A)  Patient Location: PACU  Anesthesia Type:MAC  Level of Consciousness: awake, alert  and oriented  Airway & Oxygen Therapy: Patient Spontanous Breathing  Post-op Assessment: Report given to RN and Post -op Vital signs reviewed and stable  Post vital signs: Reviewed and stable  Last Vitals:  Vitals:   09/27/16 0713  BP: 109/67  Pulse: 82  Resp: 18  Temp: 36.8 C    Last Pain:  Vitals:   09/27/16 0713  TempSrc: Oral      Patients Stated Pain Goal: 3 (03/70/48 8891)  Complications: No apparent anesthesia complications

## 2016-09-27 NOTE — Interval H&P Note (Signed)
History and Physical Interval Note:  09/27/2016 8:26 AM  Elizabeth Cunningham  has presented today for surgery, with the diagnosis of UN-NEEDED PART A CATH  The various methods of treatment have been discussed with the patient and family. After consideration of risks, benefits and other options for treatment, the patient has consented to  Procedure(s): REMOVAL PORT-A-CATH (N/A) as a surgical intervention .  The patient's history has been reviewed, patient examined, no change in status, stable for surgery.  I have reviewed the patient's chart and labs.  Questions were answered to the patient's satisfaction.     Caron Ode A.

## 2016-09-27 NOTE — H&P (Signed)
Elizabeth Cunningham is an 51 y.o. female.   Chief Complaint: port in place HPI: Pt presents for port removal after treatment for breast cancer in 2017.   Past Medical History:  Diagnosis Date  . Breast cancer ([place])    triple negative right breast   . Hypertension     Past Surgical History:  Procedure Laterality Date  . BREAST LUMPECTOMY WITH RADIOACTIVE SEED AND SENTINEL LYMPH NODE BIOPSY Right 08/15/2016   Procedure: BREAST LUMPECTOMY WITH RADIOACTIVE SEED AND SENTINEL LYMPH NODE BIOPSY;  Surgeon: Erroll Luna, MD;  Location: [facility];  Service: General;  Laterality: Right;  BREAST LUMPECTOMY WITH RADIOACTIVE SEED AND SENTINEL LYMPH NODE BIOPSY  . PORTACATH PLACEMENT      Family History  Problem Relation Age of Onset  . Adopted: Yes   Social History:  reports that she has never smoked. She has never used smokeless tobacco. She reports that she drinks alcohol. She reports that she does not use drugs.  Allergies: No Known Allergies  Medications Prior to Admission  Medication Sig Dispense Refill  . atenolol (TENORMIN) 25 MG tablet Take 25 mg by mouth daily.    . Coenzyme Q10 (CO Q 10 PO) Take 1 capsule by mouth daily. Reported on 02/08/2016    . diphenhydrAMINE (BENADRYL) 25 mg capsule Take 25 mg by mouth daily as needed for allergies.    Marland Kitchen ibuprofen (ADVIL,MOTRIN) 200 MG tablet Take 400 mg by mouth every 6 (six) hours as needed for headache or mild pain.    . Multiple Vitamins-Minerals (MULTIVITAMIN WITH MINERALS) tablet Take 1 tablet by mouth daily.    . Omega 3 1000 MG CAPS Take 2,000 mg by mouth daily.     . furosemide (LASIX) 20 MG tablet Take 1 tablet (20 mg total) by mouth daily as needed for edema. (Patient not taking: Reported on 09/13/2016) 30 tablet 0  . lidocaine-prilocaine (EMLA) cream Apply to affected area once (Patient not taking: Reported on 09/13/2016) 30 g 3  . LORazepam (ATIVAN) 0.5 MG tablet Take 1 tablet (0.5 mg total) by mouth at bedtime.  (Patient not taking: Reported on 09/13/2016) 30 tablet 0  . ondansetron (ZOFRAN) 8 MG tablet Take 1 tablet (8 mg total) by mouth 2 (two) times daily as needed. Start on the third day after chemotherapy. (Patient not taking: Reported on 09/13/2016) 30 tablet 1  . potassium chloride SA (K-DUR,KLOR-CON) 20 MEQ tablet Take 1 tablet (20 mEq total) by mouth daily as needed (When taking Lasix). (Patient not taking: Reported on 09/13/2016) 30 tablet 0  . prochlorperazine (COMPAZINE) 10 MG tablet Take 1 tablet (10 mg total) by mouth every 6 (six) hours as needed (Nausea or vomiting). (Patient not taking: Reported on 09/13/2016) 30 tablet 1    Results for orders placed or performed during the hospital encounter of 09/25/16 (from the past 48 hour(s))  Basic metabolic panel     Status: Abnormal   Collection Time: 09/25/16  2:11 PM  Result Value Ref Range   Sodium 138 135 - 145 mmol/L   Potassium 3.8 3.5 - 5.1 mmol/L   Chloride 99 (L) 101 - 111 mmol/L   CO2 27 22 - 32 mmol/L   Glucose, Bld 94 65 - 99 mg/dL   BUN 10 6 - 20 mg/dL   Creatinine, Ser 0.65 0.44 - 1.00 mg/dL   Calcium 10.2 8.9 - 10.3 mg/dL   GFR calc non Af Amer >60 >60 mL/min   GFR calc Af Amer >60 >60  mL/min    Comment: (NOTE) The eGFR has been calculated using the CKD EPI equation. This calculation has not been validated in all clinical situations. eGFR's persistently <60 mL/min signify possible Chronic Kidney Disease.    Anion gap 12 5 - 15  CBC     Status: None   Collection Time: 09/25/16  2:11 PM  Result Value Ref Range   WBC 6.3 4.0 - 10.5 K/uL   RBC 4.15 3.87 - 5.11 MIL/uL   Hemoglobin 13.0 12.0 - 15.0 g/dL   HCT 37.5 36.0 - 46.0 %   MCV 90.4 78.0 - 100.0 fL   MCH 31.3 26.0 - 34.0 pg   MCHC 34.7 30.0 - 36.0 g/dL   RDW 12.8 11.5 - 15.5 %   Platelets 294 150 - 400 K/uL   No results found.  Review of Systems  Constitutional: Negative for fever.  HENT: Negative.   Respiratory: Negative.   Cardiovascular: Negative.    Musculoskeletal: Negative.     Last menstrual period 11/08/2015. Physical Exam  Constitutional: She appears well-developed and well-nourished.  HENT:  Head: Normocephalic and atraumatic.  Eyes: Pupils are equal, round, and reactive to light.  Cardiovascular: Normal rate.   Respiratory:  Port in place      Assessment/Plan Port in place  Remove port The procedure has been discussed with the patient.  Alternative therapies have been discussed with the patient.  Operative risks include bleeding,  Infection,  Organ injury,  Nerve injury,  Blood vessel injury,  DVT,  Pulmonary embolism,  Death,  And possible reoperation.  Medical management risks include worsening of present situation.  The success of the procedure is 50 -90 % at treating patients symptoms.  The patient understands and agrees to proceed.  Ramaya Guile A., MD 09/27/2016, 7:13 AM

## 2016-09-28 ENCOUNTER — Encounter (HOSPITAL_COMMUNITY): Payer: Self-pay | Admitting: Surgery

## 2016-09-28 ENCOUNTER — Ambulatory Visit
Admission: RE | Admit: 2016-09-28 | Discharge: 2016-09-28 | Disposition: A | Discharge status: Home / Self Care | Payer: Managed Care, Other (non HMO) | Source: Ambulatory Visit | Attending: Radiation Oncology | Admitting: Radiation Oncology

## 2016-09-28 DIAGNOSIS — Z510 Encounter for antineoplastic radiation therapy: Secondary | ICD-10-CM | POA: Diagnosis not present

## 2016-09-28 DIAGNOSIS — Z170 Estrogen receptor positive status [ER+]: Principal | ICD-10-CM

## 2016-09-28 DIAGNOSIS — C50211 Malignant neoplasm of upper-inner quadrant of right female breast: Secondary | ICD-10-CM

## 2016-09-28 NOTE — Progress Notes (Signed)
   Department of Radiation Oncology  Phone:  [phone] Fax:        [phone]  Weekly Treatment Note    Name: Elizabeth Cunningham Date: 09/28/2016 MRN: [id number] DOB: 03-13-1966   Diagnosis:     ICD-9-CM ICD-10-CM   1. Malignant neoplasm of upper-inner quadrant of right breast in female, estrogen receptor positive ([place]) 174.2 C50.211    V86.0 Z17.0      Current dose: 3.6 Gy  Current fraction: 2   MEDICATIONS: Current Outpatient Prescriptions  Medication Sig Dispense Refill  . atenolol (TENORMIN) 25 MG tablet Take 25 mg by mouth daily.    . Coenzyme Q10 (CO Q 10 PO) Take 1 capsule by mouth daily. Reported on 02/08/2016    . diphenhydrAMINE (BENADRYL) 25 mg capsule Take 25 mg by mouth daily as needed for allergies.    . furosemide (LASIX) 20 MG tablet Take 1 tablet (20 mg total) by mouth daily as needed for edema. (Patient not taking: Reported on 09/13/2016) 30 tablet 0  . ibuprofen (ADVIL,MOTRIN) 200 MG tablet Take 400 mg by mouth every 6 (six) hours as needed for headache or mild pain.    Marland Kitchen lidocaine-prilocaine (EMLA) cream Apply to affected area once (Patient not taking: Reported on 09/13/2016) 30 g 3  . LORazepam (ATIVAN) 0.5 MG tablet Take 1 tablet (0.5 mg total) by mouth at bedtime. (Patient not taking: Reported on 09/13/2016) 30 tablet 0  . Multiple Vitamins-Minerals (MULTIVITAMIN WITH MINERALS) tablet Take 1 tablet by mouth daily.    . Omega 3 1000 MG CAPS Take 2,000 mg by mouth daily.     . ondansetron (ZOFRAN) 8 MG tablet Take 1 tablet (8 mg total) by mouth 2 (two) times daily as needed. Start on the third day after chemotherapy. (Patient not taking: Reported on 09/13/2016) 30 tablet 1  . potassium chloride SA (K-DUR,KLOR-CON) 20 MEQ tablet Take 1 tablet (20 mEq total) by mouth daily as needed (When taking Lasix). (Patient not taking: Reported on 09/13/2016) 30 tablet 0  . prochlorperazine (COMPAZINE) 10 MG tablet Take 1 tablet (10 mg total) by mouth every 6 (six)  hours as needed (Nausea or vomiting). (Patient not taking: Reported on 09/13/2016) 30 tablet 1   No current facility-administered medications for this encounter.      ALLERGIES: Patient has no known allergies.   LABORATORY DATA:  Lab Results  Component Value Date   WBC 6.3 09/25/2016   HGB 13.0 09/25/2016   HCT 37.5 09/25/2016   MCV 90.4 09/25/2016   PLT 294 09/25/2016   Lab Results  Component Value Date   NA 138 09/25/2016   K 3.8 09/25/2016   CL 99 (L) 09/25/2016   CO2 27 09/25/2016   Lab Results  Component Value Date   ALT 20 08/06/2016   AST 15 08/06/2016   ALKPHOS 79 08/06/2016   BILITOT 0.56 08/06/2016     NARRATIVE: Elizabeth Cunningham was seen today for weekly treatment management. The chart was checked and the patient's films were reviewed. The patient states that she has done well beginning her radiation treatment this week. She began yesterday. The treatment was uneventful.  PHYSICAL EXAMINATION: vitals were not taken for this visit.     Alert, no acute distress  ASSESSMENT: The patient is doing satisfactorily with treatment.  PLAN: We will continue with the patient's radiation treatment as planned.

## 2016-09-28 NOTE — Progress Notes (Signed)
MD saw patient in the back , no nursing assessment made 3:52 PM

## 2016-10-01 ENCOUNTER — Ambulatory Visit
Admission: RE | Admit: 2016-10-01 | Discharge: 2016-10-01 | Disposition: A | Discharge status: Home / Self Care | Payer: Managed Care, Other (non HMO) | Source: Ambulatory Visit | Attending: Radiation Oncology | Admitting: Radiation Oncology

## 2016-10-01 DIAGNOSIS — Z510 Encounter for antineoplastic radiation therapy: Secondary | ICD-10-CM | POA: Diagnosis not present

## 2016-10-02 ENCOUNTER — Ambulatory Visit
Admission: RE | Admit: 2016-10-02 | Discharge: 2016-10-02 | Disposition: A | Discharge status: Home / Self Care | Payer: Managed Care, Other (non HMO) | Source: Ambulatory Visit | Attending: Radiation Oncology | Admitting: Radiation Oncology

## 2016-10-02 DIAGNOSIS — Z510 Encounter for antineoplastic radiation therapy: Secondary | ICD-10-CM | POA: Diagnosis not present

## 2016-10-03 ENCOUNTER — Ambulatory Visit: Payer: Managed Care, Other (non HMO)

## 2016-10-04 ENCOUNTER — Ambulatory Visit
Admission: RE | Admit: 2016-10-04 | Discharge: 2016-10-04 | Disposition: A | Discharge status: Home / Self Care | Payer: Managed Care, Other (non HMO) | Source: Ambulatory Visit | Attending: Radiation Oncology | Admitting: Radiation Oncology

## 2016-10-04 DIAGNOSIS — Z510 Encounter for antineoplastic radiation therapy: Secondary | ICD-10-CM | POA: Diagnosis not present

## 2016-10-05 ENCOUNTER — Encounter: Payer: Self-pay | Admitting: Radiation Oncology

## 2016-10-05 ENCOUNTER — Ambulatory Visit
Admission: RE | Admit: 2016-10-05 | Discharge: 2016-10-05 | Disposition: A | Discharge status: Home / Self Care | Payer: Managed Care, Other (non HMO) | Source: Ambulatory Visit | Attending: Radiation Oncology | Admitting: Radiation Oncology

## 2016-10-05 VITALS — BP 135/81 | HR 81 | Temp 97.6°F | Resp 18 | Wt 154.6 lb

## 2016-10-05 DIAGNOSIS — C50211 Malignant neoplasm of upper-inner quadrant of right female breast: Secondary | ICD-10-CM

## 2016-10-05 DIAGNOSIS — Z170 Estrogen receptor positive status [ER+]: Principal | ICD-10-CM

## 2016-10-05 DIAGNOSIS — Z510 Encounter for antineoplastic radiation therapy: Secondary | ICD-10-CM | POA: Diagnosis not present

## 2016-10-05 NOTE — Progress Notes (Signed)
Department of Radiation Oncology  Phone:  [phone] Fax:        [phone]  Weekly Treatment Note    Name: Elizabeth Cunningham Date: 10/05/2016 MRN: [id number] DOB: Dec 01, 1965   Diagnosis:     ICD-9-CM ICD-10-CM   1. Malignant neoplasm of upper-inner quadrant of right breast in female, estrogen receptor positive ([place]) 174.2 C50.211    V86.0 Z17.0      Current dose: 10.8 Gy  Current fraction: 6   MEDICATIONS: Current Outpatient Prescriptions  Medication Sig Dispense Refill  . atenolol (TENORMIN) 25 MG tablet Take 25 mg by mouth daily.    . Coenzyme Q10 (CO Q 10 PO) Take 1 capsule by mouth daily. Reported on 02/08/2016    . diphenhydrAMINE (BENADRYL) 25 mg capsule Take 25 mg by mouth daily as needed for allergies.    Marland Kitchen ibuprofen (ADVIL,MOTRIN) 200 MG tablet Take 400 mg by mouth every 6 (six) hours as needed for headache or mild pain.    . Multiple Vitamins-Minerals (MULTIVITAMIN WITH MINERALS) tablet Take 1 tablet by mouth daily.    . Omega 3 1000 MG CAPS Take 2,000 mg by mouth daily.     . furosemide (LASIX) 20 MG tablet Take 1 tablet (20 mg total) by mouth daily as needed for edema. (Patient not taking: Reported on 09/13/2016) 30 tablet 0  . lidocaine-prilocaine (EMLA) cream Apply to affected area once (Patient not taking: Reported on 09/13/2016) 30 g 3  . LORazepam (ATIVAN) 0.5 MG tablet Take 1 tablet (0.5 mg total) by mouth at bedtime. (Patient not taking: Reported on 09/13/2016) 30 tablet 0  . ondansetron (ZOFRAN) 8 MG tablet Take 1 tablet (8 mg total) by mouth 2 (two) times daily as needed. Start on the third day after chemotherapy. (Patient not taking: Reported on 09/13/2016) 30 tablet 1  . potassium chloride SA (K-DUR,KLOR-CON) 20 MEQ tablet Take 1 tablet (20 mEq total) by mouth daily as needed (When taking Lasix). (Patient not taking: Reported on 09/13/2016) 30 tablet 0  . prochlorperazine (COMPAZINE) 10 MG tablet Take 1 tablet (10 mg total) by mouth every 6 (six)  hours as needed (Nausea or vomiting). (Patient not taking: Reported on 09/13/2016) 30 tablet 1   No current facility-administered medications for this encounter.      ALLERGIES: Patient has no known allergies.   LABORATORY DATA:  Lab Results  Component Value Date   WBC 6.3 09/25/2016   HGB 13.0 09/25/2016   HCT 37.5 09/25/2016   MCV 90.4 09/25/2016   PLT 294 09/25/2016   Lab Results  Component Value Date   NA 138 09/25/2016   K 3.8 09/25/2016   CL 99 (L) 09/25/2016   CO2 27 09/25/2016   Lab Results  Component Value Date   ALT 20 08/06/2016   AST 15 08/06/2016   ALKPHOS 79 08/06/2016   BILITOT 0.56 08/06/2016     NARRATIVE: VASILIA HARVILL was seen today for weekly treatment management. The chart was checked and the patient's films were reviewed.  The patient continues to do well. She missed only one day this week due to the weather. No significant skin changes.  PHYSICAL EXAMINATION: weight is 154 lb 9.6 oz (70.1 kg). Her oral temperature is 97.6 F (36.4 C). Her blood pressure is 135/81 and her pulse is 81. Her respiration is 18.        ASSESSMENT: The patient is doing satisfactorily with treatment.  PLAN: We will continue with the patient's radiation treatment as planned.

## 2016-10-05 NOTE — Progress Notes (Signed)
Weekly rad txs right  Breast 6/33 completed, no skin changes.,,skin intact, using radiaplex bid, occasional twinges in breast no pain, appetite good, no fatigue 4:01 PM BP 135/81 (BP Location: Left Arm, Patient Position: Sitting, Cuff Size: Normal)   Pulse 81   Temp 97.6 F (36.4 C) (Oral)   Resp 18   LMP 11/08/2015 Comment: chemo  Wt Readings from Last 3 Encounters:  09/13/16 156 lb (70.8 kg)  08/24/16 148 lb 8 oz (67.4 kg)  08/15/16 146 lb (66.2 kg)

## 2016-10-08 ENCOUNTER — Ambulatory Visit
Admission: RE | Admit: 2016-10-08 | Discharge: 2016-10-08 | Disposition: A | Discharge status: Home / Self Care | Payer: Managed Care, Other (non HMO) | Source: Ambulatory Visit | Attending: Radiation Oncology | Admitting: Radiation Oncology

## 2016-10-08 DIAGNOSIS — Z510 Encounter for antineoplastic radiation therapy: Secondary | ICD-10-CM | POA: Diagnosis not present

## 2016-10-09 ENCOUNTER — Ambulatory Visit
Admission: RE | Admit: 2016-10-09 | Discharge: 2016-10-09 | Disposition: A | Discharge status: Home / Self Care | Payer: Managed Care, Other (non HMO) | Source: Ambulatory Visit | Attending: Radiation Oncology | Admitting: Radiation Oncology

## 2016-10-09 DIAGNOSIS — Z510 Encounter for antineoplastic radiation therapy: Secondary | ICD-10-CM | POA: Diagnosis not present

## 2016-10-10 ENCOUNTER — Ambulatory Visit
Admission: RE | Admit: 2016-10-10 | Discharge: 2016-10-10 | Disposition: A | Discharge status: Home / Self Care | Payer: Managed Care, Other (non HMO) | Source: Ambulatory Visit | Attending: Radiation Oncology | Admitting: Radiation Oncology

## 2016-10-10 DIAGNOSIS — Z510 Encounter for antineoplastic radiation therapy: Secondary | ICD-10-CM | POA: Diagnosis not present

## 2016-10-11 ENCOUNTER — Ambulatory Visit
Admission: RE | Admit: 2016-10-11 | Discharge: 2016-10-11 | Disposition: A | Discharge status: Home / Self Care | Payer: Managed Care, Other (non HMO) | Source: Ambulatory Visit | Attending: Radiation Oncology | Admitting: Radiation Oncology

## 2016-10-11 DIAGNOSIS — Z510 Encounter for antineoplastic radiation therapy: Secondary | ICD-10-CM | POA: Diagnosis not present

## 2016-10-12 ENCOUNTER — Ambulatory Visit
Admission: RE | Admit: 2016-10-12 | Discharge: 2016-10-12 | Disposition: A | Discharge status: Home / Self Care | Payer: Managed Care, Other (non HMO) | Source: Ambulatory Visit | Attending: Radiation Oncology | Admitting: Radiation Oncology

## 2016-10-12 ENCOUNTER — Encounter: Payer: Self-pay | Admitting: Radiation Oncology

## 2016-10-12 VITALS — BP 138/84 | HR 71 | Temp 98.1°F | Resp 18 | Ht 65.0 in | Wt 157.4 lb

## 2016-10-12 DIAGNOSIS — Z510 Encounter for antineoplastic radiation therapy: Secondary | ICD-10-CM | POA: Diagnosis not present

## 2016-10-12 DIAGNOSIS — Z170 Estrogen receptor positive status [ER+]: Principal | ICD-10-CM

## 2016-10-12 DIAGNOSIS — C50211 Malignant neoplasm of upper-inner quadrant of right female breast: Secondary | ICD-10-CM

## 2016-10-12 NOTE — Progress Notes (Signed)
   Department of Radiation Oncology  Phone:  [phone] Fax:        [phone]  Weekly Treatment Note    Name: Elizabeth Cunningham Date: 10/12/2016 MRN: [id number] DOB: Aug 29, 1966   Diagnosis:     ICD-9-CM ICD-10-CM   1. Malignant neoplasm of upper-inner quadrant of right breast in female, estrogen receptor positive ([place]) 174.2 C50.211    V86.0 Z17.0      Current dose: 19.8 Gy  Current fraction: 11   MEDICATIONS: Current Outpatient Prescriptions  Medication Sig Dispense Refill  . atenolol (TENORMIN) 25 MG tablet Take 25 mg by mouth daily.    . Coenzyme Q10 (CO Q 10 PO) Take 1 capsule by mouth daily. Reported on 02/08/2016    . Multiple Vitamins-Minerals (MULTIVITAMIN WITH MINERALS) tablet Take 1 tablet by mouth daily.    . Omega 3 1000 MG CAPS Take 2,000 mg by mouth daily.     . diphenhydrAMINE (BENADRYL) 25 mg capsule Take 25 mg by mouth daily as needed for allergies.    . furosemide (LASIX) 20 MG tablet Take 1 tablet (20 mg total) by mouth daily as needed for edema. (Patient not taking: Reported on 10/12/2016) 30 tablet 0  . ibuprofen (ADVIL,MOTRIN) 200 MG tablet Take 400 mg by mouth every 6 (six) hours as needed for headache or mild pain.    Marland Kitchen lidocaine-prilocaine (EMLA) cream Apply to affected area once (Patient not taking: Reported on 10/12/2016) 30 g 3  . LORazepam (ATIVAN) 0.5 MG tablet Take 1 tablet (0.5 mg total) by mouth at bedtime. (Patient not taking: Reported on 10/12/2016) 30 tablet 0  . potassium chloride SA (K-DUR,KLOR-CON) 20 MEQ tablet Take 1 tablet (20 mEq total) by mouth daily as needed (When taking Lasix). (Patient not taking: Reported on 10/12/2016) 30 tablet 0   No current facility-administered medications for this encounter.      ALLERGIES: Patient has no known allergies.   LABORATORY DATA:  Lab Results  Component Value Date   WBC 6.3 09/25/2016   HGB 13.0 09/25/2016   HCT 37.5 09/25/2016   MCV 90.4 09/25/2016   PLT 294 09/25/2016   Lab  Results  Component Value Date   NA 138 09/25/2016   K 3.8 09/25/2016   CL 99 (L) 09/25/2016   CO2 27 09/25/2016   Lab Results  Component Value Date   ALT 20 08/06/2016   AST 15 08/06/2016   ALKPHOS 79 08/06/2016   BILITOT 0.56 08/06/2016     NARRATIVE: Elizabeth Cunningham was seen today for weekly treatment management. The chart was checked and the patient's films were reviewed.  The patient states that she is doing very well. She is notice minimal change in the treatment area. No significant skin irritation.  PHYSICAL EXAMINATION: height is 5\' 5"  (1.651 m) and weight is 157 lb 6.4 oz (71.4 kg). Her oral temperature is 98.1 F (36.7 C). Her blood pressure is 138/84 and her pulse is 71. Her respiration is 18 and oxygen saturation is 100%.      Mild erythema treatment area  ASSESSMENT: The patient is doing satisfactorily with treatment.  PLAN: We will continue with the patient's radiation treatment as planned.

## 2016-10-12 NOTE — Progress Notes (Signed)
Weekly rad txs right  Breast 11/33 completed, no skin changes.,,skin intact, using radiaplex bid, occasional twinges in breast no pain, appetite good, no fatigue. Wt Readings from Last 3 Encounters:  10/12/16 157 lb 6.4 oz (71.4 kg)  10/05/16 154 lb 9.6 oz (70.1 kg)  09/13/16 156 lb (70.8 kg)  BP 138/84   Pulse 71   Temp 98.1 F (36.7 C) (Oral)   Resp 18   Ht 5\' 5"  (1.651 m)   Wt 157 lb 6.4 oz (71.4 kg)   LMP 11/08/2015 Comment: chemo  SpO2 100%   BMI 26.19 kg/m

## 2016-10-15 ENCOUNTER — Ambulatory Visit: Payer: Managed Care, Other (non HMO)

## 2016-10-16 ENCOUNTER — Ambulatory Visit
Admission: RE | Admit: 2016-10-16 | Discharge: 2016-10-16 | Disposition: A | Discharge status: Home / Self Care | Payer: Managed Care, Other (non HMO) | Source: Ambulatory Visit | Attending: Radiation Oncology | Admitting: Radiation Oncology

## 2016-10-16 DIAGNOSIS — Z510 Encounter for antineoplastic radiation therapy: Secondary | ICD-10-CM | POA: Diagnosis not present

## 2016-10-17 ENCOUNTER — Ambulatory Visit
Admission: RE | Admit: 2016-10-17 | Discharge: 2016-10-17 | Disposition: A | Discharge status: Home / Self Care | Payer: Managed Care, Other (non HMO) | Source: Ambulatory Visit | Attending: Radiation Oncology | Admitting: Radiation Oncology

## 2016-10-17 DIAGNOSIS — Z510 Encounter for antineoplastic radiation therapy: Secondary | ICD-10-CM | POA: Diagnosis not present

## 2016-10-18 ENCOUNTER — Ambulatory Visit
Admission: RE | Admit: 2016-10-18 | Discharge: 2016-10-18 | Disposition: A | Discharge status: Home / Self Care | Payer: Managed Care, Other (non HMO) | Source: Ambulatory Visit | Attending: Radiation Oncology | Admitting: Radiation Oncology

## 2016-10-18 DIAGNOSIS — Z510 Encounter for antineoplastic radiation therapy: Secondary | ICD-10-CM | POA: Diagnosis not present

## 2016-10-19 ENCOUNTER — Ambulatory Visit
Admission: RE | Admit: 2016-10-19 | Discharge: 2016-10-19 | Disposition: A | Discharge status: Home / Self Care | Payer: Managed Care, Other (non HMO) | Source: Ambulatory Visit | Attending: Radiation Oncology | Admitting: Radiation Oncology

## 2016-10-19 ENCOUNTER — Encounter: Payer: Self-pay | Admitting: Radiation Oncology

## 2016-10-19 DIAGNOSIS — Z510 Encounter for antineoplastic radiation therapy: Secondary | ICD-10-CM | POA: Diagnosis not present

## 2016-10-22 ENCOUNTER — Ambulatory Visit
Admission: RE | Admit: 2016-10-22 | Discharge: 2016-10-22 | Disposition: A | Discharge status: Home / Self Care | Payer: Managed Care, Other (non HMO) | Source: Ambulatory Visit | Attending: Radiation Oncology | Admitting: Radiation Oncology

## 2016-10-22 DIAGNOSIS — Z510 Encounter for antineoplastic radiation therapy: Secondary | ICD-10-CM | POA: Diagnosis not present

## 2016-10-23 ENCOUNTER — Ambulatory Visit
Admission: RE | Admit: 2016-10-23 | Discharge: 2016-10-23 | Disposition: A | Discharge status: Home / Self Care | Payer: Managed Care, Other (non HMO) | Source: Ambulatory Visit | Attending: Radiation Oncology | Admitting: Radiation Oncology

## 2016-10-23 DIAGNOSIS — Z510 Encounter for antineoplastic radiation therapy: Secondary | ICD-10-CM | POA: Diagnosis not present

## 2016-10-24 ENCOUNTER — Ambulatory Visit
Admission: RE | Admit: 2016-10-24 | Discharge: 2016-10-24 | Disposition: A | Discharge status: Home / Self Care | Payer: Managed Care, Other (non HMO) | Source: Ambulatory Visit | Attending: Radiation Oncology | Admitting: Radiation Oncology

## 2016-10-24 DIAGNOSIS — Z510 Encounter for antineoplastic radiation therapy: Secondary | ICD-10-CM | POA: Diagnosis not present

## 2016-10-25 ENCOUNTER — Ambulatory Visit
Admission: RE | Admit: 2016-10-25 | Discharge: 2016-10-25 | Disposition: A | Discharge status: Home / Self Care | Payer: Managed Care, Other (non HMO) | Source: Ambulatory Visit | Attending: Radiation Oncology | Admitting: Radiation Oncology

## 2016-10-25 ENCOUNTER — Encounter: Payer: Self-pay | Admitting: Radiation Oncology

## 2016-10-25 DIAGNOSIS — Z510 Encounter for antineoplastic radiation therapy: Secondary | ICD-10-CM | POA: Diagnosis not present

## 2016-10-26 ENCOUNTER — Ambulatory Visit
Admission: RE | Admit: 2016-10-26 | Discharge: 2016-10-26 | Disposition: A | Discharge status: Home / Self Care | Payer: Managed Care, Other (non HMO) | Source: Ambulatory Visit | Attending: Radiation Oncology | Admitting: Radiation Oncology

## 2016-10-26 ENCOUNTER — Encounter: Payer: Self-pay | Admitting: Radiation Oncology

## 2016-10-26 VITALS — BP 133/77 | HR 72 | Temp 98.3°F | Resp 20 | Wt 155.0 lb

## 2016-10-26 DIAGNOSIS — C50211 Malignant neoplasm of upper-inner quadrant of right female breast: Secondary | ICD-10-CM

## 2016-10-26 DIAGNOSIS — Z170 Estrogen receptor positive status [ER+]: Principal | ICD-10-CM

## 2016-10-26 DIAGNOSIS — Z510 Encounter for antineoplastic radiation therapy: Secondary | ICD-10-CM | POA: Diagnosis not present

## 2016-10-26 NOTE — Progress Notes (Signed)
   Department of Radiation Oncology  Phone:  [phone] Fax:        [phone]  Weekly Treatment Note    Name: Elizabeth Cunningham Date: 10/26/2016 MRN: [id number] DOB: Dec 10, 1965   Diagnosis:     ICD-9-CM ICD-10-CM   1. Malignant neoplasm of upper-inner quadrant of right breast in female, estrogen receptor positive ([place]) 174.2 C50.211    V86.0 Z17.0      Current dose: 36 Gy  Current fraction: 20   MEDICATIONS: Current Outpatient Prescriptions  Medication Sig Dispense Refill  . atenolol (TENORMIN) 25 MG tablet Take 25 mg by mouth daily.    . Coenzyme Q10 (CO Q 10 PO) Take 1 capsule by mouth daily. Reported on 02/08/2016    . diphenhydrAMINE (BENADRYL) 25 mg capsule Take 25 mg by mouth daily as needed for allergies.    . furosemide (LASIX) 20 MG tablet Take 1 tablet (20 mg total) by mouth daily as needed for edema. (Patient not taking: Reported on 10/12/2016) 30 tablet 0  . ibuprofen (ADVIL,MOTRIN) 200 MG tablet Take 400 mg by mouth every 6 (six) hours as needed for headache or mild pain.    Marland Kitchen lidocaine-prilocaine (EMLA) cream Apply to affected area once (Patient not taking: Reported on 10/12/2016) 30 g 3  . LORazepam (ATIVAN) 0.5 MG tablet Take 1 tablet (0.5 mg total) by mouth at bedtime. (Patient not taking: Reported on 10/12/2016) 30 tablet 0  . Multiple Vitamins-Minerals (MULTIVITAMIN WITH MINERALS) tablet Take 1 tablet by mouth daily.    . Omega 3 1000 MG CAPS Take 2,000 mg by mouth daily.     . potassium chloride SA (K-DUR,KLOR-CON) 20 MEQ tablet Take 1 tablet (20 mEq total) by mouth daily as needed (When taking Lasix). (Patient not taking: Reported on 10/12/2016) 30 tablet 0   No current facility-administered medications for this encounter.      ALLERGIES: Patient has no known allergies.   LABORATORY DATA:  Lab Results  Component Value Date   WBC 6.3 09/25/2016   HGB 13.0 09/25/2016   HCT 37.5 09/25/2016   MCV 90.4 09/25/2016   PLT 294 09/25/2016   Lab  Results  Component Value Date   NA 138 09/25/2016   K 3.8 09/25/2016   CL 99 (L) 09/25/2016   CO2 27 09/25/2016   Lab Results  Component Value Date   ALT 20 08/06/2016   AST 15 08/06/2016   ALKPHOS 79 08/06/2016   BILITOT 0.56 08/06/2016     NARRATIVE: Elizabeth Cunningham was seen today for weekly treatment management. The chart was checked and the patient's films were reviewed.  The patient states that she is doing very well. No major difficulties this past week. Skin cream being used daily.  PHYSICAL EXAMINATION: weight is 155 lb (70.3 kg). Her oral temperature is 98.3 F (36.8 C). Her blood pressure is 133/77 and her pulse is 72. Her respiration is 20.      Mild hyperpigmentation in the treatment area  ASSESSMENT: The patient is doing satisfactorily with treatment.  PLAN: We will continue with the patient's radiation treatment as planned.

## 2016-10-26 NOTE — Progress Notes (Signed)
Complex simulation note  Diagnosis: right-sided breast cancer  Narrative The patient has initially been planned to receive a course of whole breast radiation to a dose of 50.4 Gy in 28 fractions. The patient will now receive an additional boost to the seroma cavity which has been contoured. This will correspond to a boost of 10 Gy at 2 Gy per fraction. To accomplish this, an additional 3 customized blocks have been designed for this purpose. A complex isodose plan is requested to ensure that the target area is adequately covered with radiation dose and that the nearby normal structures such as the lung are adequately spared. The patient's final total dose will be 60.4 Gy.  ------------------------------------------------  Raul Winterhalter S. Kenzel Ruesch, MD, PhD 

## 2016-10-26 NOTE — Progress Notes (Signed)
Weekly rad tyx right breast 20/33 completed, mild erythmea,  Skin intact,   Occasional twinges in breast, appetite good, mild fatigue BP 133/77 (BP Location: Left Arm, Patient Position: Sitting, Cuff Size: Normal)   Pulse 72   Temp 98.3 F (36.8 C) (Oral)   Resp 20   Wt 155 lb (70.3 kg)   LMP 11/08/2015 Comment: chemo  BMI 25.79 kg/m   Wt Readings from Last 3 Encounters:  10/26/16 155 lb (70.3 kg)  10/12/16 157 lb 6.4 oz (71.4 kg)  10/05/16 154 lb 9.6 oz (70.1 kg)

## 2016-10-29 ENCOUNTER — Ambulatory Visit
Admission: RE | Admit: 2016-10-29 | Discharge: 2016-10-29 | Disposition: A | Discharge status: Home / Self Care | Payer: Managed Care, Other (non HMO) | Source: Ambulatory Visit | Attending: Radiation Oncology | Admitting: Radiation Oncology

## 2016-10-29 DIAGNOSIS — Z510 Encounter for antineoplastic radiation therapy: Secondary | ICD-10-CM | POA: Diagnosis not present

## 2016-10-30 ENCOUNTER — Ambulatory Visit
Admission: RE | Admit: 2016-10-30 | Discharge: 2016-10-30 | Disposition: A | Discharge status: Home / Self Care | Payer: Managed Care, Other (non HMO) | Source: Ambulatory Visit | Attending: Radiation Oncology | Admitting: Radiation Oncology

## 2016-10-30 DIAGNOSIS — Z510 Encounter for antineoplastic radiation therapy: Secondary | ICD-10-CM | POA: Diagnosis not present

## 2016-10-31 ENCOUNTER — Ambulatory Visit
Admission: RE | Admit: 2016-10-31 | Discharge: 2016-10-31 | Disposition: A | Discharge status: Home / Self Care | Payer: Managed Care, Other (non HMO) | Source: Ambulatory Visit | Attending: Radiation Oncology | Admitting: Radiation Oncology

## 2016-10-31 ENCOUNTER — Encounter: Payer: Self-pay | Admitting: Radiation Oncology

## 2016-10-31 VITALS — BP 124/78 | HR 78 | Temp 98.3°F | Resp 20 | Wt 155.2 lb

## 2016-10-31 DIAGNOSIS — Z170 Estrogen receptor positive status [ER+]: Principal | ICD-10-CM

## 2016-10-31 DIAGNOSIS — C50211 Malignant neoplasm of upper-inner quadrant of right female breast: Secondary | ICD-10-CM

## 2016-10-31 DIAGNOSIS — Z510 Encounter for antineoplastic radiation therapy: Secondary | ICD-10-CM | POA: Diagnosis not present

## 2016-10-31 NOTE — Progress Notes (Signed)
Weekly rad txs right breast,23/33,  Mild  erythema, skin intact, occasional twinges in right breast, appetite good, mild fatigue BP 124/78 (BP Location: Left Arm, Patient Position: Sitting, Cuff Size: Normal)   Pulse 78   Temp 98.3 F (36.8 C) (Oral)   Resp 20   Wt 155 lb 3.2 oz (70.4 kg)   LMP 11/08/2015 Comment: chemo  BMI 25.83 kg/m   Wt Readings from Last 3 Encounters:  10/31/16 155 lb 3.2 oz (70.4 kg)  10/26/16 155 lb (70.3 kg)  10/12/16 157 lb 6.4 oz (71.4 kg)

## 2016-10-31 NOTE — Progress Notes (Signed)
Department of Radiation Oncology  Phone:  [phone] Fax:        [phone]  Weekly Treatment Note    Name: Elizabeth Cunningham Date: 10/31/2016 MRN: [id number] DOB: [birth year]   Diagnosis:     ICD-9-CM ICD-10-CM   1. Malignant neoplasm of upper-inner quadrant of right breast in female, estrogen receptor positive ([place]) 174.2 C50.211    V86.0 Z17.0      Current dose: 41.4 Gy  Current fraction: 23   MEDICATIONS: Current Outpatient Prescriptions  Medication Sig Dispense Refill  . atenolol (TENORMIN) 25 MG tablet Take 25 mg by mouth daily.    . Coenzyme Q10 (CO Q 10 PO) Take 1 capsule by mouth daily. Reported on 02/08/2016    . diphenhydrAMINE (BENADRYL) 25 mg capsule Take 25 mg by mouth daily as needed for allergies.    Marland Kitchen ibuprofen (ADVIL,MOTRIN) 200 MG tablet Take 400 mg by mouth every 6 (six) hours as needed for headache or mild pain.    . Multiple Vitamins-Minerals (MULTIVITAMIN WITH MINERALS) tablet Take 1 tablet by mouth daily.    . Omega 3 1000 MG CAPS Take 2,000 mg by mouth daily.     . furosemide (LASIX) 20 MG tablet Take 1 tablet (20 mg total) by mouth daily as needed for edema. (Patient not taking: Reported on 10/12/2016) 30 tablet 0  . lidocaine-prilocaine (EMLA) cream Apply to affected area once (Patient not taking: Reported on 10/12/2016) 30 g 3  . LORazepam (ATIVAN) 0.5 MG tablet Take 1 tablet (0.5 mg total) by mouth at bedtime. (Patient not taking: Reported on 10/12/2016) 30 tablet 0  . potassium chloride SA (K-DUR,KLOR-CON) 20 MEQ tablet Take 1 tablet (20 mEq total) by mouth daily as needed (When taking Lasix). (Patient not taking: Reported on 10/12/2016) 30 tablet 0   No current facility-administered medications for this encounter.      ALLERGIES: Patient has no known allergies.   LABORATORY DATA:  Lab Results  Component Value Date   WBC 6.3 09/25/2016   HGB 13.0 09/25/2016   HCT 37.5 09/25/2016   MCV 90.4 09/25/2016   PLT 294 09/25/2016   Lab  Results  Component Value Date   NA 138 09/25/2016   K 3.8 09/25/2016   CL 99 (L) 09/25/2016   CO2 27 09/25/2016   Lab Results  Component Value Date   ALT 20 08/06/2016   AST 15 08/06/2016   ALKPHOS 79 08/06/2016   BILITOT 0.56 08/06/2016     NARRATIVE: Elizabeth Cunningham was seen today for weekly treatment management. The chart was checked and the patient's films were reviewed.  The patient has completed 23/33 treatments to the right breast. The nurse notes mild erythema and intact skin in the treatment area. The patient reports occasional twinges in the right breast, a good appetite, and mild fatigue. She states she is doing well overall.  PHYSICAL EXAMINATION: weight is 155 lb 3.2 oz (70.4 kg). Her oral temperature is 98.3 F (36.8 C). Her blood pressure is 124/78 and her pulse is 78. Her respiration is 20.   Hyperpigmentation of the right breast with no desquamation.  ASSESSMENT: The patient is doing satisfactorily with treatment.  PLAN: We will continue with the patient's radiation treatment as planned.  ------------------------------------------------  Jodelle Gross, MD, PhD  This document serves as a record of services personally performed by Kyung Rudd, MD. It was created on his behalf by Darcus Austin, a trained medical scribe. The creation of this record is based on  the scribe's personal observations and the provider's statements to them. This document has been checked and approved by the attending provider.

## 2016-11-01 ENCOUNTER — Ambulatory Visit: Payer: Managed Care, Other (non HMO) | Admitting: Radiation Oncology

## 2016-11-01 ENCOUNTER — Encounter: Payer: Self-pay | Admitting: Radiation Oncology

## 2016-11-01 ENCOUNTER — Ambulatory Visit
Admission: RE | Admit: 2016-11-01 | Discharge: 2016-11-01 | Disposition: A | Discharge status: Home / Self Care | Payer: Managed Care, Other (non HMO) | Source: Ambulatory Visit | Attending: Radiation Oncology | Admitting: Radiation Oncology

## 2016-11-01 DIAGNOSIS — Z510 Encounter for antineoplastic radiation therapy: Secondary | ICD-10-CM | POA: Diagnosis not present

## 2016-11-02 ENCOUNTER — Encounter: Payer: Self-pay | Admitting: Radiation Oncology

## 2016-11-02 ENCOUNTER — Ambulatory Visit
Admission: RE | Admit: 2016-11-02 | Discharge: 2016-11-02 | Disposition: A | Discharge status: Home / Self Care | Payer: Managed Care, Other (non HMO) | Source: Ambulatory Visit | Attending: Radiation Oncology | Admitting: Radiation Oncology

## 2016-11-02 DIAGNOSIS — Z510 Encounter for antineoplastic radiation therapy: Secondary | ICD-10-CM | POA: Diagnosis not present

## 2016-11-05 ENCOUNTER — Ambulatory Visit
Admission: RE | Admit: 2016-11-05 | Discharge: 2016-11-05 | Disposition: A | Discharge status: Home / Self Care | Payer: Managed Care, Other (non HMO) | Source: Ambulatory Visit | Attending: Radiation Oncology | Admitting: Radiation Oncology

## 2016-11-05 DIAGNOSIS — Z510 Encounter for antineoplastic radiation therapy: Secondary | ICD-10-CM | POA: Diagnosis not present

## 2016-11-06 ENCOUNTER — Ambulatory Visit
Admission: RE | Admit: 2016-11-06 | Discharge: 2016-11-06 | Disposition: A | Discharge status: Home / Self Care | Payer: Managed Care, Other (non HMO) | Source: Ambulatory Visit | Attending: Radiation Oncology | Admitting: Radiation Oncology

## 2016-11-06 ENCOUNTER — Ambulatory Visit: Payer: Managed Care, Other (non HMO)

## 2016-11-06 DIAGNOSIS — Z510 Encounter for antineoplastic radiation therapy: Secondary | ICD-10-CM | POA: Diagnosis not present

## 2016-11-07 ENCOUNTER — Ambulatory Visit: Payer: Managed Care, Other (non HMO)

## 2016-11-07 ENCOUNTER — Ambulatory Visit
Admission: RE | Admit: 2016-11-07 | Discharge: 2016-11-07 | Disposition: A | Discharge status: Home / Self Care | Payer: Managed Care, Other (non HMO) | Source: Ambulatory Visit | Attending: Radiation Oncology | Admitting: Radiation Oncology

## 2016-11-07 DIAGNOSIS — Z510 Encounter for antineoplastic radiation therapy: Secondary | ICD-10-CM | POA: Diagnosis not present

## 2016-11-08 ENCOUNTER — Ambulatory Visit
Admission: RE | Admit: 2016-11-08 | Discharge: 2016-11-08 | Disposition: A | Discharge status: Home / Self Care | Payer: Managed Care, Other (non HMO) | Source: Ambulatory Visit | Attending: Radiation Oncology | Admitting: Radiation Oncology

## 2016-11-08 ENCOUNTER — Ambulatory Visit: Payer: Managed Care, Other (non HMO)

## 2016-11-08 DIAGNOSIS — Z510 Encounter for antineoplastic radiation therapy: Secondary | ICD-10-CM | POA: Diagnosis not present

## 2016-11-09 ENCOUNTER — Ambulatory Visit
Admission: RE | Admit: 2016-11-09 | Discharge: 2016-11-09 | Disposition: A | Discharge status: Home / Self Care | Payer: Managed Care, Other (non HMO) | Source: Ambulatory Visit | Attending: Radiation Oncology | Admitting: Radiation Oncology

## 2016-11-09 ENCOUNTER — Encounter: Payer: Self-pay | Admitting: Radiation Oncology

## 2016-11-09 VITALS — BP 143/86 | HR 72 | Temp 98.0°F | Resp 20 | Wt 153.8 lb

## 2016-11-09 DIAGNOSIS — C50211 Malignant neoplasm of upper-inner quadrant of right female breast: Secondary | ICD-10-CM

## 2016-11-09 DIAGNOSIS — Z510 Encounter for antineoplastic radiation therapy: Secondary | ICD-10-CM | POA: Diagnosis not present

## 2016-11-09 MED ORDER — RADIAPLEXRX EX GEL
Freq: Once | CUTANEOUS | Status: AC
  Administered 2016-11-09: 16:00:00 via TOPICAL

## 2016-11-09 NOTE — Progress Notes (Signed)
Weekly rad txs rad tx right breast 30/33 completed, bright erythema, skin intact, slight soreness in brest,dry, gave another rdaiplex tube, will complete next Wednesday 4:07 PM BP (!) 143/86 (BP Location: Left Arm, Patient Position: Sitting, Cuff Size: Normal)   Pulse 72   Temp 98 F (36.7 C) (Oral)   Resp 20   Wt 153 lb 12.8 oz (69.8 kg)   LMP 11/08/2015 Comment: chemo  BMI 25.59 kg/m   Wt Readings from Last 3 Encounters:  11/09/16 153 lb 12.8 oz (69.8 kg)  10/31/16 155 lb 3.2 oz (70.4 kg)  10/26/16 155 lb (70.3 kg)

## 2016-11-11 NOTE — Progress Notes (Signed)
Department of Radiation Oncology  Phone:  [phone] Fax:        [phone]  Weekly Treatment Note    Name: Elizabeth Cunningham Date: 11/11/2016 MRN: [id number] DOB: 02/25/1966   Diagnosis:     ICD-9-CM ICD-10-CM   1. Malignant neoplasm of upper-inner quadrant of right female breast, unspecified estrogen receptor status ([facility]) 174.2 C50.211 hyaluronate sodium (RADIAPLEXRX) gel     Current dose: 54.4 Gy  Current fraction: 30   MEDICATIONS: Current Outpatient Prescriptions  Medication Sig Dispense Refill  . atenolol (TENORMIN) 25 MG tablet Take 25 mg by mouth daily.    . Coenzyme Q10 (CO Q 10 PO) Take 1 capsule by mouth daily. Reported on 02/08/2016    . diphenhydrAMINE (BENADRYL) 25 mg capsule Take 25 mg by mouth daily as needed for allergies.    Marland Kitchen ibuprofen (ADVIL,MOTRIN) 200 MG tablet Take 400 mg by mouth every 6 (six) hours as needed for headache or mild pain.    . Multiple Vitamins-Minerals (MULTIVITAMIN WITH MINERALS) tablet Take 1 tablet by mouth daily.    . Omega 3 1000 MG CAPS Take 2,000 mg by mouth daily.     . furosemide (LASIX) 20 MG tablet Take 1 tablet (20 mg total) by mouth daily as needed for edema. (Patient not taking: Reported on 10/12/2016) 30 tablet 0  . lidocaine-prilocaine (EMLA) cream Apply to affected area once (Patient not taking: Reported on 10/12/2016) 30 g 3  . LORazepam (ATIVAN) 0.5 MG tablet Take 1 tablet (0.5 mg total) by mouth at bedtime. (Patient not taking: Reported on 10/12/2016) 30 tablet 0  . potassium chloride SA (K-DUR,KLOR-CON) 20 MEQ tablet Take 1 tablet (20 mEq total) by mouth daily as needed (When taking Lasix). (Patient not taking: Reported on 10/12/2016) 30 tablet 0   No current facility-administered medications for this encounter.      ALLERGIES: Patient has no known allergies.   LABORATORY DATA:  Lab Results  Component Value Date   WBC 6.3 09/25/2016   HGB 13.0 09/25/2016   HCT 37.5 09/25/2016   MCV 90.4 09/25/2016   PLT 294 09/25/2016   Lab Results  Component Value Date   NA 138 09/25/2016   K 3.8 09/25/2016   CL 99 (L) 09/25/2016   CO2 27 09/25/2016   Lab Results  Component Value Date   ALT 20 08/06/2016   AST 15 08/06/2016   ALKPHOS 79 08/06/2016   BILITOT 0.56 08/06/2016     NARRATIVE: Elizabeth Cunningham was seen today for weekly treatment management. The chart was checked and the patient's films were reviewed.  The patient states that she continues to do very well. No major changes.  Weekly rad txs rad tx right breast 30/33 completed, bright erythema, skin intact, slight soreness in brest,dry, gave another rdaiplex tube, will complete next Wednesday 2:25 PM BP (!) 143/86 (BP Location: Left Arm, Patient Position: Sitting, Cuff Size: Normal)   Pulse 72   Temp 98 F (36.7 C) (Oral)   Resp 20   Wt 153 lb 12.8 oz (69.8 kg)   LMP 11/08/2015 Comment: chemo  BMI 25.59 kg/m   Wt Readings from Last 3 Encounters:  11/09/16 153 lb 12.8 oz (69.8 kg)  10/31/16 155 lb 3.2 oz (70.4 kg)  10/26/16 155 lb (70.3 kg)    PHYSICAL EXAMINATION: weight is 153 lb 12.8 oz (69.8 kg). Her oral temperature is 98 F (36.7 C). Her blood pressure is 143/86 (abnormal) and her pulse is 72. Her respiration is 20.  Hyperpigmentation diffusely in the treatment area with some erythema. No moist desquamation.  ASSESSMENT: The patient is doing satisfactorily with treatment.  PLAN: We will continue with the patient's radiation treatment as planned.

## 2016-11-12 ENCOUNTER — Ambulatory Visit: Payer: Managed Care, Other (non HMO)

## 2016-11-12 ENCOUNTER — Ambulatory Visit
Admission: RE | Admit: 2016-11-12 | Discharge: 2016-11-12 | Disposition: A | Discharge status: Home / Self Care | Payer: Managed Care, Other (non HMO) | Source: Ambulatory Visit | Attending: Radiation Oncology | Admitting: Radiation Oncology

## 2016-11-12 DIAGNOSIS — Z510 Encounter for antineoplastic radiation therapy: Secondary | ICD-10-CM | POA: Diagnosis not present

## 2016-11-13 ENCOUNTER — Ambulatory Visit
Admission: RE | Admit: 2016-11-13 | Discharge: 2016-11-13 | Disposition: A | Discharge status: Home / Self Care | Payer: Managed Care, Other (non HMO) | Source: Ambulatory Visit | Attending: Radiation Oncology | Admitting: Radiation Oncology

## 2016-11-13 ENCOUNTER — Ambulatory Visit: Payer: Managed Care, Other (non HMO)

## 2016-11-13 DIAGNOSIS — Z510 Encounter for antineoplastic radiation therapy: Secondary | ICD-10-CM | POA: Diagnosis not present

## 2016-11-14 ENCOUNTER — Ambulatory Visit
Admission: RE | Admit: 2016-11-14 | Discharge: 2016-11-14 | Disposition: A | Discharge status: Home / Self Care | Payer: Managed Care, Other (non HMO) | Source: Ambulatory Visit | Attending: Radiation Oncology | Admitting: Radiation Oncology

## 2016-11-14 ENCOUNTER — Telehealth: Payer: Self-pay | Admitting: Adult Health

## 2016-11-14 ENCOUNTER — Telehealth: Payer: Self-pay | Admitting: *Deleted

## 2016-11-14 ENCOUNTER — Encounter: Payer: Self-pay | Admitting: Radiation Oncology

## 2016-11-14 VITALS — BP 138/93 | HR 76 | Temp 98.2°F | Resp 20

## 2016-11-14 DIAGNOSIS — C50211 Malignant neoplasm of upper-inner quadrant of right female breast: Secondary | ICD-10-CM

## 2016-11-14 DIAGNOSIS — Z171 Estrogen receptor negative status [ER-]: Principal | ICD-10-CM

## 2016-11-14 DIAGNOSIS — Z510 Encounter for antineoplastic radiation therapy: Secondary | ICD-10-CM | POA: Diagnosis not present

## 2016-11-14 NOTE — Telephone Encounter (Signed)
Per Bryson Ha , asked to call and see if patient could come in by 330pm today as Dr. Lisbeth Renshaw is leaving at 4pm, called and spokew with patient, she will lwave now,but takes an hour to get here, thanked Barnetta Chapel 2:32 PM

## 2016-11-14 NOTE — Telephone Encounter (Signed)
  Oncology Nurse Navigator Documentation  Navigator Location: [facility] (11/14/16 0900)   )Navigator Encounter Type: Telephone (11/14/16 0900) Telephone: Elizabeth Cunningham Call (11/14/16 0900)  Relate doing well and without complaints. Discussed survivorship     Genetic Counseling Date: 03/26/16 (11/14/16 0900) Genetic Counseling Type: Non-Urgent (11/14/16 0900) Plastic Surgery Consult Date:  (None) (11/14/16 0900) Multidisiplinary Clinic Date:  (None) (11/14/16 0900) Multidisiplinary Clinic Type: Breast (11/14/16 0900)   Patient Visit Type: RadOnc (11/14/16 0900) Treatment Phase: Final Radiation Tx (11/14/16 0900) Barriers/Navigation Needs: No barriers at this time;No Questions;No Needs (11/14/16 0900)   Interventions: Referrals (11/14/16 0900) Referrals: Survivorship (11/14/16 0900)          Acuity: Level 1 (11/14/16 0900)         Time Spent with Patient: 15 (11/14/16 0900)

## 2016-11-14 NOTE — Progress Notes (Signed)
Weekly rad txs right breast 33/33 completd, f/u appt given 12/11/16 with Shona Simpson, PA-C, bright erythema, soreness in breast, using radiaplex bid,  Appetite okay,  3:34 PM Wt Readings from Last 3 Encounters:  11/09/16 153 lb 12.8 oz (69.8 kg)  10/31/16 155 lb 3.2 oz (70.4 kg)  10/26/16 155 lb (70.3 kg)  BP (!) 138/93 (BP Location: Right Arm, Patient Position: Sitting, Cuff Size: Normal)   Pulse 76   Temp 98.2 F (36.8 C) (Oral)   Resp 20   LMP 11/08/2015 Comment: chemo

## 2016-11-14 NOTE — Telephone Encounter (Signed)
Called patient and let them know of the SCP scheduled for September Per Charlestine Massed NP. Sending letter in the mail as reminder .

## 2016-11-16 NOTE — Progress Notes (Signed)
Department of Radiation Oncology  Phone:  [phone] Fax:        [phone]  Weekly Treatment Note    Name: Elizabeth Cunningham Date: 11/16/2016 MRN: [id number] DOB: Sep 02, 1966   Diagnosis:     ICD-9-CM ICD-10-CM   1. Malignant neoplasm of upper-inner quadrant of right breast in female, estrogen receptor negative ([place]) 174.2 C50.211    V86.1 Z17.1      Current dose: 60.4 Gy  Current fraction: 33   MEDICATIONS: Current Outpatient Prescriptions  Medication Sig Dispense Refill  . atenolol (TENORMIN) 25 MG tablet Take 25 mg by mouth daily.    . Coenzyme Q10 (CO Q 10 PO) Take 1 capsule by mouth daily. Reported on 02/08/2016    . diphenhydrAMINE (BENADRYL) 25 mg capsule Take 25 mg by mouth daily as needed for allergies.    . furosemide (LASIX) 20 MG tablet Take 1 tablet (20 mg total) by mouth daily as needed for edema. (Patient not taking: Reported on 10/12/2016) 30 tablet 0  . ibuprofen (ADVIL,MOTRIN) 200 MG tablet Take 400 mg by mouth every 6 (six) hours as needed for headache or mild pain.    Marland Kitchen lidocaine-prilocaine (EMLA) cream Apply to affected area once (Patient not taking: Reported on 10/12/2016) 30 g 3  . LORazepam (ATIVAN) 0.5 MG tablet Take 1 tablet (0.5 mg total) by mouth at bedtime. (Patient not taking: Reported on 10/12/2016) 30 tablet 0  . Multiple Vitamins-Minerals (MULTIVITAMIN WITH MINERALS) tablet Take 1 tablet by mouth daily.    . Omega 3 1000 MG CAPS Take 2,000 mg by mouth daily.     . potassium chloride SA (K-DUR,KLOR-CON) 20 MEQ tablet Take 1 tablet (20 mEq total) by mouth daily as needed (When taking Lasix). (Patient not taking: Reported on 10/12/2016) 30 tablet 0   No current facility-administered medications for this encounter.      ALLERGIES: Patient has no known allergies.   LABORATORY DATA:  Lab Results  Component Value Date   WBC 6.3 09/25/2016   HGB 13.0 09/25/2016   HCT 37.5 09/25/2016   MCV 90.4 09/25/2016   PLT 294 09/25/2016   Lab  Results  Component Value Date   NA 138 09/25/2016   K 3.8 09/25/2016   CL 99 (L) 09/25/2016   CO2 27 09/25/2016   Lab Results  Component Value Date   ALT 20 08/06/2016   AST 15 08/06/2016   ALKPHOS 79 08/06/2016   BILITOT 0.56 08/06/2016     NARRATIVE: Elizabeth Cunningham was seen today for weekly treatment management. The chart was checked and the patient's films were reviewed.  The patient finishes her final fraction today. She has done very well over the last week. No major changes in skin irritation with some moderate irritative symptoms.  Weekly rad txs right breast 33/33 completd, f/u appt given 12/11/16 with Shona Simpson, PA-C, bright erythema, soreness in breast, using radiaplex bid,  Appetite okay,  12:26 PM Wt Readings from Last 3 Encounters:  11/09/16 153 lb 12.8 oz (69.8 kg)  10/31/16 155 lb 3.2 oz (70.4 kg)  10/26/16 155 lb (70.3 kg)  BP (!) 138/93 (BP Location: Right Arm, Patient Position: Sitting, Cuff Size: Normal)   Pulse 76   Temp 98.2 F (36.8 C) (Oral)   Resp 20   LMP 11/08/2015 Comment: chemo  PHYSICAL EXAMINATION: oral temperature is 98.2 F (36.8 C). Her blood pressure is 138/93 (abnormal) and her pulse is 76. Her respiration is 20.      Erythema  diffusely in the treatment area without significant desquamation. Overall her skin looks quite good.  ASSESSMENT: The patient is doing satisfactorily with treatment.  The patient finished her final fraction today and has done very well.  PLAN: Follow-up in one month.

## 2016-11-26 ENCOUNTER — Encounter: Payer: Self-pay | Admitting: Radiation Oncology

## 2016-11-26 NOTE — Progress Notes (Signed)
  Radiation Oncology         ([phone]) [phone] ________________________________  Name: Elizabeth Cunningham MRN: [id number]  Date: 11/26/2016  DOB: September 04, 1966  End of Treatment Note  Diagnosis:   Clinical stage IIA (T2, N0) grade 3 invasive ductal carcinoma of the right breast (triple negative); (ypT0, ypN0)     Indication for treatment:  Curative       Radiation treatment dates:   09/27/16-11/14/16  Site/dose:   1) Right breast/ 50.4 Gy in 28 fractions   2) Right breast boost/ 10 Gy in 5 fractions  Beams/energy:   1) 3D / 10X, 6X    2) Isodose plan/ 10X, 6X  Narrative: The patient tolerated radiation treatment relatively well.   During treatment, the patient reported irritation in the treatment field.  Plan: The patient has completed radiation treatment. The patient will return to radiation oncology clinic for routine followup in one month. I advised them to call or return sooner if they have any questions or concerns related to their recovery or treatment.  ------------------------------------------------  Jodelle Gross, MD, PhD  This document serves as a record of services personally performed by Kyung Rudd, MD. It was created on his behalf by Bethann Humble, a trained medical scribe. The creation of this record is based on the scribe's personal observations and the provider's statements to them. This document has been checked and approved by the attending provider.

## 2016-12-11 ENCOUNTER — Ambulatory Visit: Payer: Self-pay | Admitting: Radiation Oncology

## 2016-12-17 NOTE — Progress Notes (Signed)
Elizabeth Cunningham. [name] 52 y.o. woman with Stage IIA (T2, N0) grade 3 invasive ductal carcinoma of the right breast (triple negative) completed radiation 11-14-16  one month FU.  Skin status:Normal skin to right breast, breast is still hard to touch. What lotion are you using? Using a lotion with vitamin E. Have you seen med onc? If not, when is appointment:08-24-16 Dr. [name] next appointment 02-22-17 If they are ER+, have they started Al or Tamoxifen? If not, why? Triple negative Discuss survivorship appointment. 05-24-17 at [facility] Have you had a mammogram scheduled? Not scheduled yet Offer referral to [facility]. Will receive at the survivorship appointment.05-24-17 Oncotype Dx. Score:N/A Appetite:Good eating three meals per day Pain:None Fatigue:Having mild fatigue. Arm mobility:Denies having problems raising her right arm, mentioned having a little pull. Lymphedema:None Wt Readings from Last 3 Encounters:  12/24/16 154 lb 3.2 oz (69.9 kg)  11/09/16 153 lb 12.8 oz (69.8 kg)  10/31/16 155 lb 3.2 oz (70.4 kg)  BP 139/79   Pulse 76   Temp 98 F (36.7 C) (Oral)   Resp 18   Ht 5\' 5"  (1.651 m)   Wt 154 lb 3.2 oz (69.9 kg)   LMP 11/08/2015 Comment: chemo  SpO2 100%   BMI 25.66 kg/m

## 2016-12-24 ENCOUNTER — Encounter: Payer: Self-pay | Admitting: Radiation Oncology

## 2016-12-24 ENCOUNTER — Ambulatory Visit
Admission: RE | Admit: 2016-12-24 | Discharge: 2016-12-24 | Disposition: A | Discharge status: Home / Self Care | Payer: Managed Care, Other (non HMO) | Source: Ambulatory Visit | Attending: Radiation Oncology | Admitting: Radiation Oncology

## 2016-12-24 VITALS — BP 139/79 | HR 76 | Temp 98.0°F | Resp 18 | Ht 65.0 in | Wt 154.2 lb

## 2016-12-24 DIAGNOSIS — C50911 Malignant neoplasm of unspecified site of right female breast: Secondary | ICD-10-CM | POA: Insufficient documentation

## 2016-12-24 DIAGNOSIS — C50211 Malignant neoplasm of upper-inner quadrant of right female breast: Secondary | ICD-10-CM

## 2016-12-26 NOTE — Progress Notes (Signed)
Radiation Oncology         (336) [phone] ________________________________  Name: Elizabeth Cunningham MRN: [id number]  Date: 12/24/2016  DOB: [birth year]  Post Treatment Note  CC: Anthoney Harada, MD  Nicholas Lose, MD  Diagnosis:   Stage IIA, T2, N0, triple negative, grade 3 invasive ductal carcinoma of the right breast  Interval Since Last Radiation: 6 weeks   09/27/16-11/14/16: 1.  Right breast/ 50.4 Gy in 28 fractions 2.  Right breast boost/ 10 Gy in 5 fractions   Narrative:  The patient returns today for routine follow-up. The patient tolerated radiotherapy well without significant difficulty.                           On review of systems, the patient states she's feeling well since treatment. She is using lotion with vitamin E at this time. She denies concerns with chest pain, fevers, or edema. No other complaints are verbalized.  ALLERGIES:  has No Known Allergies.  Meds: Current Outpatient Prescriptions  Medication Sig Dispense Refill  . atenolol (TENORMIN) 25 MG tablet Take 25 mg by mouth daily.    . Coenzyme Q10 (CO Q 10 PO) Take 1 capsule by mouth daily. Reported on 02/08/2016    . Multiple Vitamins-Minerals (MULTIVITAMIN WITH MINERALS) tablet Take 1 tablet by mouth daily.    . Omega 3 1000 MG CAPS Take 2,000 mg by mouth daily.     . diphenhydrAMINE (BENADRYL) 25 mg capsule Take 25 mg by mouth daily as needed for allergies.    . furosemide (LASIX) 20 MG tablet Take 1 tablet (20 mg total) by mouth daily as needed for edema. (Patient not taking: Reported on 12/24/2016) 30 tablet 0  . ibuprofen (ADVIL,MOTRIN) 200 MG tablet Take 400 mg by mouth every 6 (six) hours as needed for headache or mild pain.    Marland Kitchen lidocaine-prilocaine (EMLA) cream Apply to affected area once (Patient not taking: Reported on 10/12/2016) 30 g 3  . LORazepam (ATIVAN) 0.5 MG tablet Take 1 tablet (0.5 mg total) by mouth at bedtime. (Patient not taking: Reported on 12/24/2016) 30 tablet 0  . potassium  chloride SA (K-DUR,KLOR-CON) 20 MEQ tablet Take 1 tablet (20 mEq total) by mouth daily as needed (When taking Lasix). (Patient not taking: Reported on 10/12/2016) 30 tablet 0   No current facility-administered medications for this encounter.     Physical Findings:  height is 5\' 5"  (1.651 m) and weight is 154 lb 3.2 oz (69.9 kg). Her oral temperature is 98 F (36.7 C). Her blood pressure is 139/79 and her pulse is 76. Her respiration is 18 and oxygen saturation is 100%.  Pain Assessment Pain Score: 0-No pain/10 In general this is a well appearing caucasian female in no acute distress. She's alert and oriented x4 and appropriate throughout the examination. Cardiopulmonary assessment is negative for acute distress and she exhibits normal effort. The right breast is intact with minimal evidence of hyperpigmentation. No desquamation is otherwise noted.  Lab Findings: Lab Results  Component Value Date   WBC 6.3 09/25/2016   HGB 13.0 09/25/2016   HCT 37.5 09/25/2016   MCV 90.4 09/25/2016   PLT 294 09/25/2016     Radiographic Findings: No results found.  Impression/Plan: 1. Stage IIA, T2, N0, triple negative, grade 3 invasive ductal carcinoma of the right breast. The patient has recovered well since completion of radiotherapy. She will continue to be followed closely by Dr. [name], and will return  for follow up evaluation as needed for questions or concerns regarding her previous therapy. 2. Survivorship. The patient  will meet with survivorship clinic in September 2018 and was given information regarding other resources here at the cancer center.     Carola Rhine, PAC

## 2017-01-01 ENCOUNTER — Encounter (HOSPITAL_COMMUNITY): Payer: Self-pay | Admitting: Internal Medicine

## 2017-01-01 ENCOUNTER — Ambulatory Visit (HOSPITAL_COMMUNITY)
Admission: RE | Admit: 2017-01-01 | Discharge: 2017-01-01 | Disposition: A | Discharge status: Home / Self Care | Payer: Managed Care, Other (non HMO) | Source: Ambulatory Visit | Attending: Family Medicine | Admitting: Family Medicine

## 2017-01-01 ENCOUNTER — Ambulatory Visit (HOSPITAL_BASED_OUTPATIENT_CLINIC_OR_DEPARTMENT_OTHER)
Admission: RE | Admit: 2017-01-01 | Discharge: 2017-01-01 | Disposition: A | Discharge status: Home / Self Care | Payer: Managed Care, Other (non HMO) | Source: Ambulatory Visit | Attending: Internal Medicine | Admitting: Internal Medicine

## 2017-01-01 VITALS — BP 132/76 | HR 64 | Wt 157.5 lb

## 2017-01-01 DIAGNOSIS — I071 Rheumatic tricuspid insufficiency: Secondary | ICD-10-CM | POA: Insufficient documentation

## 2017-01-01 DIAGNOSIS — C50211 Malignant neoplasm of upper-inner quadrant of right female breast: Secondary | ICD-10-CM

## 2017-01-01 DIAGNOSIS — I371 Nonrheumatic pulmonary valve insufficiency: Secondary | ICD-10-CM | POA: Insufficient documentation

## 2017-01-01 DIAGNOSIS — I340 Nonrheumatic mitral (valve) insufficiency: Secondary | ICD-10-CM | POA: Insufficient documentation

## 2017-01-01 LAB — ECHOCARDIOGRAM COMPLETE: Weight: 2520 oz

## 2017-01-01 NOTE — Progress Notes (Signed)
CARDIO-ONCOLOGY CLINIC NOTE  Referring Physician: [name]   HPI:  Elizabeth Cunningham is 51 y/o woman with R breast CA T2NO referred by Dr. [name] for enrollment into the [facility].    Breast cancer of upper-inner quadrant of right female breast ([place])   01/20/2016 Initial Diagnosis    Right breast biopsy 12:00: Invasive ductal carcinoma, grade 3, lymphovascular invasion is present, ER 0%, PR 0%, Ki-67 95%, HER-2 negative ratio 1.19; mamm and [place] irregular mass 2.9 x 1.9 x 1.6 cm, no axillary LN T2 N0 stage IIA clinical stage     02/16/2016 -  Neo-Adjuvant Chemotherapy    Dose dense Adriamycin and Cytoxan 4 followed by Abraxane weekly 12      Doing very well.  Remains active. Surveillance scans all clear.  Denies any dyspnea, CP or edema.  Received AC x 4 finished July 2017. Also completed XRT.   Echo today reviewed personally  EF 60-65% GLS -19.3% LS 1 - 12.1 cm/sec  Past Medical History:  Diagnosis Date  . Breast cancer ([place])    triple negative right breast   . Hypertension     Current Outpatient Prescriptions  Medication Sig Dispense Refill  . atenolol (TENORMIN) 25 MG tablet Take 25 mg by mouth daily.    . Coenzyme Q10 (CO Q 10 PO) Take 1 capsule by mouth daily. Reported on 02/08/2016    . diphenhydrAMINE (BENADRYL) 25 mg capsule Take 25 mg by mouth daily as needed for allergies.    . Multiple Vitamins-Minerals (MULTIVITAMIN WITH MINERALS) tablet Take 1 tablet by mouth daily.    . Omega 3 1000 MG CAPS Take 2,000 mg by mouth daily.     . furosemide (LASIX) 20 MG tablet Take 1 tablet (20 mg total) by mouth daily as needed for edema. (Patient not taking: Reported on 12/24/2016) 30 tablet 0  . ibuprofen (ADVIL,MOTRIN) 200 MG tablet Take 400 mg by mouth every 6 (six) hours as needed for headache or mild pain.    . potassium chloride SA (K-DUR,KLOR-CON) 20 MEQ tablet Take 1 tablet (20 mEq total) by mouth daily as needed (When taking Lasix). (Patient not taking: Reported  on 10/12/2016) 30 tablet 0   No current facility-administered medications for this encounter.     No Known Allergies    Social History   Social History  . Marital status: Married    Spouse name: N/A  . Number of children: 2  . Years of education: N/A   Occupational History  . Not on file.   Social History Main Topics  . Smoking status: Never Smoker  . Smokeless tobacco: Never Used  . Alcohol use Yes     Comment: social  . Drug use: No  . Sexual activity: Not on file   Other Topics Concern  . Not on file   Social History Narrative  . No narrative on file      Family History  Problem Relation Age of Onset  . Adopted: Yes    Vitals:   01/01/17 1204  BP: 132/76  Pulse: 64  SpO2: 100%  Weight: 157 lb 8 oz (71.4 kg)    PHYSICAL EXAM: General:  Well appearing. No resp difficulty HEENT: normal Neck: supple. no JVD. Carotids 2+ bilat; no bruits. No lymphadenopathy or thryomegaly appreciated. Cor: PMI nondisplaced. Regular rate & rhythm. No rubs, gallops or murmurs. Lungs: clear Abdomen: soft, nontender, nondistended. No hepatosplenomegaly. No bruits or masses. Good bowel sounds. Extremities: no cyanosis, clubbing, rash, edema Neuro: alert &  orientedx3, cranial nerves grossly intact. moves all 4 extremities w/o difficulty. Affect pleasant    ASSESSMENT & PLAN: 1. Breast cancer of upper-inner quadrant of right female breast ([place]) - triple negative --Right breast biopsy 01/20/2016 12:00: Invasive ductal carcinoma, grade 3, lymphovascular invasion is present, ER 0%, PR 0%, Ki-67 95%, HER-2 negative ratio 1.19; mamm and [place] irregular mass 2.9 x 1.9 x 1.6 cm, no axillary LN T2 N0 stage IIA clinical stage -- completed Neoadjuvant chemotherapy with Adriamycin and Cytoxan dose dense 4 in July 2017. And Abraxane x 12 weeks. + XRT --I reviewed echos personally. EF and Doppler parameters stable. No HF on exam.  I explained incidence of Adriamycin cardiotoxicity in detail  include small possibility of very delayed toxicity.     Omarius Grantham,MD 12:25 PM

## 2017-02-15 NOTE — Addendum Note (Signed)
Addendum  created 02/15/17 0912 by Rica Koyanagi, MD   Sign clinical note

## 2017-02-22 ENCOUNTER — Ambulatory Visit (HOSPITAL_BASED_OUTPATIENT_CLINIC_OR_DEPARTMENT_OTHER): Payer: Managed Care, Other (non HMO) | Admitting: Hematology and Oncology

## 2017-02-22 ENCOUNTER — Encounter: Payer: Self-pay | Admitting: Hematology and Oncology

## 2017-02-22 DIAGNOSIS — Z853 Personal history of malignant neoplasm of breast: Secondary | ICD-10-CM | POA: Diagnosis not present

## 2017-02-22 DIAGNOSIS — C50211 Malignant neoplasm of upper-inner quadrant of right female breast: Secondary | ICD-10-CM

## 2017-02-22 DIAGNOSIS — Z171 Estrogen receptor negative status [ER-]: Principal | ICD-10-CM

## 2017-02-22 NOTE — Assessment & Plan Note (Signed)
Right breast biopsy 01/20/2016 12:00: Invasive ductal carcinoma, grade 3, lymphovascular invasion is present, ER 0%, PR 0%, Ki-67 95%, HER-2 negative ratio 1.19; mamm and [place] irregular mass 2.9 x 1.9 x 1.6 cm, no axillary LN T2 N0 stage IIA clinical stage  Treatment Summary 1. Neoadjuvant chemotherapy with Adriamycin and Cytoxan dose dense 4 followed by Abraxane weekly 12 started 02/16/2016 completed 06/27/2016 2. Followed by Lumpectomy 08/14/16: Path CR 0/8 LN neg 3. Genetic counseling and testing on 03/26/2016: Normal;no mutations identified 4. Adjuvant radiation therapy completed 11/14/2016  Plan: surveillance 1. Breast exam 02/22/2017: Benign 2. mammograms will need to be ordered.  Return to clinic in 1 year for follow-up

## 2017-02-22 NOTE — Progress Notes (Signed)
Patient Care Team: Vernie Shanks, MD as PCP - General (Family Medicine)  DIAGNOSIS:  Encounter Diagnosis  Name Primary?  . Malignant neoplasm of upper-inner quadrant of right breast in female, estrogen receptor negative ([place])     SUMMARY OF ONCOLOGIC HISTORY:   Breast cancer of upper-inner quadrant of right female breast ([place])   01/20/2016 Initial Diagnosis    Right breast biopsy 12:00: Invasive ductal carcinoma, grade 3, lymphovascular invasion is present, ER 0%, PR 0%, Ki-67 95%, HER-2 negative ratio 1.19; mamm and [place] irregular mass 2.9 x 1.9 x 1.6 cm, no axillary LN T2 N0 stage IIA clinical stage      02/16/2016 - 06/27/2016 Neo-Adjuvant Chemotherapy    Dose dense Adriamycin and Cytoxan 4 followed by Abraxane weekly 12      05/04/2016 Miscellaneous    Genetic testing was normal      06/29/2016 Breast MRI    Complete radiologic response      08/15/2016 Surgery    Rt Lumpectomy: No malignancy 0/8 LN neg, Path CR      09/27/2016 - 11/14/2016 Radiation Therapy    Adjuvant radiation therapy       CHIEF COMPLIANT: Surveillance of breast cancer  INTERVAL HISTORY: Elizabeth Cunningham is a 51 year old with above-mentioned history of right breast cancer who underwent neoadjuvant chemotherapy and had a complete pathologic response and followed by adjuvant radiation. She is here for a surveillance checkup. She reports no major problems other than mild discomfort in the breast and axilla. She denies any lumps or nodules. 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  REVIEW OF SYSTEMS:   Constitutional: Denies fevers, chills or abnormal weight loss Eyes: Denies blurriness of vision Ears, nose, mouth, throat, and face: Denies mucositis or sore throat Respiratory: Denies cough, dyspnea or wheezes Cardiovascular: Denies palpitation, chest discomfort Gastrointestinal:  Denies nausea, heartburn or change in bowel habits Skin: Denies abnormal skin rashes Lymphatics: Denies new  lymphadenopathy or easy bruising Neurological:Denies numbness, tingling or new weaknesses Behavioral/Psych: Mood is stable, no new changes  Extremities: No lower extremity edema Breast:  Mild discomfort in the right axilla All other systems were reviewed with the patient and are negative.  I have reviewed the past medical history, past surgical history, social history and family history with the patient and they are unchanged from previous note.  ALLERGIES:  has No Known Allergies.  MEDICATIONS:  Current Outpatient Prescriptions  Medication Sig Dispense Refill  . atenolol (TENORMIN) 25 MG tablet Take 25 mg by mouth daily.    . Coenzyme Q10 (CO Q 10 PO) Take 1 capsule by mouth daily. Reported on 02/08/2016    . diphenhydrAMINE (BENADRYL) 25 mg capsule Take 25 mg by mouth daily as needed for allergies.    . furosemide (LASIX) 20 MG tablet Take 1 tablet (20 mg total) by mouth daily as needed for edema. (Patient not taking: Reported on 12/24/2016) 30 tablet 0  . ibuprofen (ADVIL,MOTRIN) 200 MG tablet Take 400 mg by mouth every 6 (six) hours as needed for headache or mild pain.    . Multiple Vitamins-Minerals (MULTIVITAMIN WITH MINERALS) tablet Take 1 tablet by mouth daily.    . Omega 3 1000 MG CAPS Take 2,000 mg by mouth daily.     . potassium chloride SA (K-DUR,KLOR-CON) 20 MEQ tablet Take 1 tablet (20 mEq total) by mouth daily as needed (When taking Lasix). (Patient not taking: Reported on 10/12/2016) 30 tablet 0   No current facility-administered medications for this visit.  PHYSICAL EXAMINATION: ECOG PERFORMANCE STATUS: 1 - Symptomatic but completely ambulatory  Vitals:   02/22/17 1130  BP: (!) 135/92  Pulse: 81  Resp: 18  Temp: 98.4 F (36.9 C)   Filed Weights   02/22/17 1130  Weight: 152 lb 14.4 oz (69.4 kg)    GENERAL:alert, no distress and comfortable SKIN: skin color, texture, turgor are normal, no rashes or significant lesions EYES: normal, Conjunctiva are pink and  non-injected, sclera clear OROPHARYNX:no exudate, no erythema and lips, buccal mucosa, and tongue normal  NECK: supple, thyroid normal size, non-tender, without nodularity LYMPH:  no palpable lymphadenopathy in the cervical, axillary or inguinal LUNGS: clear to auscultation and percussion with normal breathing effort HEART: regular rate & rhythm and no murmurs and no lower extremity edema ABDOMEN:abdomen soft, non-tender and normal bowel sounds MUSCULOSKELETAL:no cyanosis of digits and no clubbing  NEURO: alert & oriented x 3 with fluent speech, no focal motor/sensory deficits EXTREMITIES: No lower extremity edema BREAST: No palpable masses or nodules in either right or left breasts. No palpable axillary supraclavicular or infraclavicular adenopathy no breast tenderness or nipple discharge. (exam performed in the presence of a chaperone)  LABORATORY DATA:  I have reviewed the data as listed   Chemistry      Component Value Date/Time   NA 138 09/25/2016 1411   NA 141 08/06/2016 1524   K 3.8 09/25/2016 1411   K 3.9 08/06/2016 1524   CL 99 (L) 09/25/2016 1411   CO2 27 09/25/2016 1411   CO2 27 08/06/2016 1524   BUN 10 09/25/2016 1411   BUN 10.7 08/06/2016 1524   CREATININE 0.65 09/25/2016 1411   CREATININE 0.7 08/06/2016 1524      Component Value Date/Time   CALCIUM 10.2 09/25/2016 1411   CALCIUM 10.0 08/06/2016 1524   ALKPHOS 79 08/06/2016 1524   AST 15 08/06/2016 1524   ALT 20 08/06/2016 1524   BILITOT 0.56 08/06/2016 1524       Lab Results  Component Value Date   WBC 6.3 09/25/2016   HGB 13.0 09/25/2016   HCT 37.5 09/25/2016   MCV 90.4 09/25/2016   PLT 294 09/25/2016   NEUTROABS 4.2 08/06/2016    ASSESSMENT & PLAN:  Breast cancer of upper-inner quadrant of right female breast ([place]) Right breast biopsy 01/20/2016 12:00: Invasive ductal carcinoma, grade 3, lymphovascular invasion is present, ER 0%, PR 0%, Ki-67 95%, HER-2 negative ratio 1.19; mamm and [place] irregular  mass 2.9 x 1.9 x 1.6 cm, no axillary LN T2 N0 stage IIA clinical stage  Treatment Summary 1. Neoadjuvant chemotherapy with Adriamycin and Cytoxan dose dense 4 followed by Abraxane weekly 12 started 02/16/2016 completed 06/27/2016 2. Followed by Lumpectomy 08/14/16: Path CR 0/8 LN neg 3. Genetic counseling and testing on 03/26/2016: Normal;no mutations identified 4. Adjuvant radiation therapy completed 11/14/2016  Plan: surveillance 1. Breast exam 02/22/2017: Benign 2. mammograms Were ordered to be done at breast Center in the next couple of weeks.  Return to clinic in 1 year for follow-up   I spent 25 minutes talking to the patient of which more than half was spent in counseling and coordination of care.  No orders of the defined types were placed in this encounter.  The patient has a good understanding of the overall plan. she agrees with it. she will call with any problems that may develop before the next visit here.   Rulon Eisenmenger, MD 02/22/17

## 2017-03-07 ENCOUNTER — Ambulatory Visit
Admission: RE | Admit: 2017-03-07 | Discharge: 2017-03-07 | Disposition: A | Discharge status: Home / Self Care | Payer: Managed Care, Other (non HMO) | Source: Ambulatory Visit | Attending: Hematology and Oncology | Admitting: Hematology and Oncology

## 2017-03-07 DIAGNOSIS — C50211 Malignant neoplasm of upper-inner quadrant of right female breast: Secondary | ICD-10-CM

## 2017-03-07 DIAGNOSIS — Z171 Estrogen receptor negative status [ER-]: Principal | ICD-10-CM

## 2017-05-23 ENCOUNTER — Telehealth: Payer: Self-pay | Admitting: Hematology and Oncology

## 2017-05-23 NOTE — Telephone Encounter (Signed)
Pt called to r/s SCP appt to 9/26 at 0900

## 2017-05-24 ENCOUNTER — Encounter: Payer: Managed Care, Other (non HMO) | Admitting: Adult Health

## 2017-06-04 DIAGNOSIS — Z0000 Encounter for general adult medical examination without abnormal findings: Secondary | ICD-10-CM | POA: Diagnosis not present

## 2017-06-10 ENCOUNTER — Telehealth: Payer: Self-pay

## 2017-06-10 NOTE — Telephone Encounter (Signed)
Call made to patient about SCP visit on 06/12/17.  Patient out of town and asked to reschedule.  New appt is on 06/27/17 @ 1 pm.

## 2017-06-12 ENCOUNTER — Encounter: Payer: Self-pay | Admitting: Adult Health

## 2017-06-27 ENCOUNTER — Encounter: Payer: Self-pay | Admitting: Adult Health

## 2017-06-27 ENCOUNTER — Ambulatory Visit (HOSPITAL_BASED_OUTPATIENT_CLINIC_OR_DEPARTMENT_OTHER): Payer: BLUE CROSS/BLUE SHIELD | Admitting: Adult Health

## 2017-06-27 VITALS — BP 168/86 | HR 97 | Temp 98.2°F | Resp 18 | Ht 65.0 in | Wt 156.4 lb

## 2017-06-27 DIAGNOSIS — R0989 Other specified symptoms and signs involving the circulatory and respiratory systems: Secondary | ICD-10-CM | POA: Diagnosis not present

## 2017-06-27 DIAGNOSIS — Z853 Personal history of malignant neoplasm of breast: Secondary | ICD-10-CM

## 2017-06-27 DIAGNOSIS — Z171 Estrogen receptor negative status [ER-]: Secondary | ICD-10-CM

## 2017-06-27 DIAGNOSIS — C50211 Malignant neoplasm of upper-inner quadrant of right female breast: Secondary | ICD-10-CM

## 2017-06-27 NOTE — Progress Notes (Signed)
CLINIC:  Survivorship   REASON FOR VISIT:  Routine follow-up post-treatment for a recent history of breast cancer.  BRIEF ONCOLOGIC HISTORY:    Breast cancer of upper-inner quadrant of right female breast ([place])   01/20/2016 Initial Diagnosis    Right breast biopsy 12:00: Invasive ductal carcinoma, grade 3, lymphovascular invasion is present, ER 0%, PR 0%, Ki-67 95%, HER-2 negative ratio 1.19; mamm and [place] irregular mass 2.9 x 1.9 x 1.6 cm, no axillary LN T2 N0 stage IIA clinical stage      02/16/2016 - 06/27/2016 Neo-Adjuvant Chemotherapy    Dose dense Adriamycin and Cytoxan 4 followed by Abraxane weekly 12      05/04/2016 Miscellaneous    Genetic testing was normal      06/29/2016 Breast MRI    Complete radiologic response      08/15/2016 Surgery    Rt Lumpectomy: No malignancy 0/8 LN neg, Path CR      09/27/2016 - 11/14/2016 Radiation Therapy    Adjuvant radiation therapy       INTERVAL HISTORY:  Elizabeth Cunningham presents to the [facility] today for our initial meeting to review her survivorship care plan detailing her treatment course for breast cancer, as well as monitoring long-term side effects of that treatment, education regarding health maintenance, screening, and overall wellness and health promotion.     Overall, Elizabeth Cunningham reports feeling quite well.  She is c/o a throat issue for the past couple of months.  She has this sensation that something is stuck in her throat and saw her PCP for this and they recommended allergy medication.      REVIEW OF SYSTEMS:  Review of Systems  Constitutional: Negative for appetite change, chills, fatigue, fever and unexpected weight change.  HENT:   Negative for hearing loss and lump/mass.   Eyes: Negative for eye problems and icterus.  Respiratory: Negative for chest tightness, cough and shortness of breath.   Cardiovascular: Negative for chest pain, leg swelling and palpitations.  Gastrointestinal: Negative for  abdominal distention, abdominal pain, constipation, diarrhea, nausea and vomiting.  Endocrine: Negative for hot flashes.  Genitourinary: Negative for difficulty urinating.   Musculoskeletal: Negative for arthralgias.  Skin: Negative for itching and rash.  Neurological: Negative for dizziness, extremity weakness, headaches and numbness.  Psychiatric/Behavioral: Negative for depression. The patient is not nervous/anxious.   Breast: Denies any new nodularity, masses, tenderness, nipple changes, or nipple discharge.      ONCOLOGY TREATMENT TEAM:  1. Surgeon:  Dr. Brantley Stage at [facility] 2. Medical Oncologist: Dr. [name]  3. Radiation Oncologist: Dr. Lisbeth Renshaw    PAST MEDICAL/SURGICAL HISTORY:  Past Medical History:  Diagnosis Date  . Breast cancer ([place]) 01/20/2016   triple negative right breast   . Hypertension    Past Surgical History:  Procedure Laterality Date  . BREAST BIOPSY Right 01/20/2016   U/S Core- Benign  . BREAST LUMPECTOMY Right 08/15/2016  . BREAST LUMPECTOMY WITH RADIOACTIVE SEED AND SENTINEL LYMPH NODE BIOPSY Right 08/15/2016   Procedure: BREAST LUMPECTOMY WITH RADIOACTIVE SEED AND SENTINEL LYMPH NODE BIOPSY;  Surgeon: Erroll Luna, MD;  Location: [facility];  Service: General;  Laterality: Right;  BREAST LUMPECTOMY WITH RADIOACTIVE SEED AND SENTINEL LYMPH NODE BIOPSY  . PORT-A-CATH REMOVAL N/A 09/27/2016   Procedure: REMOVAL PORT-A-CATH;  Surgeon: Erroll Luna, MD;  Location: [facility];  Service: General;  Laterality: N/A;  . PORTACATH PLACEMENT       ALLERGIES:  No Known Allergies   CURRENT  MEDICATIONS:  Outpatient Encounter Prescriptions as of 06/27/2017  Medication Sig  . Coenzyme Q10 (CO Q 10 PO) Take 1 capsule by mouth daily. Reported on 02/08/2016  . ibuprofen (ADVIL,MOTRIN) 200 MG tablet Take 400 mg by mouth every 6 (six) hours as needed for headache or mild pain.  Marland Kitchen losartan (COZAAR) 25 MG tablet   . Multiple Vitamins-Minerals  (MULTIVITAMIN WITH MINERALS) tablet Take 1 tablet by mouth daily.  . [DISCONTINUED] atenolol (TENORMIN) 25 MG tablet Take 25 mg by mouth daily.  . [DISCONTINUED] diphenhydrAMINE (BENADRYL) 25 mg capsule Take 25 mg by mouth daily as needed for allergies.  . [DISCONTINUED] furosemide (LASIX) 20 MG tablet Take 1 tablet (20 mg total) by mouth daily as needed for edema. (Patient not taking: Reported on 12/24/2016)  . [DISCONTINUED] Omega 3 1000 MG CAPS Take 2,000 mg by mouth daily.   . [DISCONTINUED] potassium chloride SA (K-DUR,KLOR-CON) 20 MEQ tablet Take 1 tablet (20 mEq total) by mouth daily as needed (When taking Lasix). (Patient not taking: Reported on 10/12/2016)   No facility-administered encounter medications on file as of 06/27/2017.      ONCOLOGIC FAMILY HISTORY:  Family History  Problem Relation Age of Onset  . Adopted: Yes    SOCIAL HISTORY:  Elizabeth Cunningham is married and lives with her husband, mother and brother in [place], [place].  She has 2 children and they live in [place] and [place].  Elizabeth Cunningham is currently working full time.  She denies any current or history of tobacco, alcohol, or illicit drug use.     PHYSICAL EXAMINATION:  Vital Signs:   Vitals:   06/27/17 1347  BP: (!) 168/86  Pulse: 97  Resp: 18  Temp: 98.2 F (36.8 C)  SpO2: 100%   Filed Weights   06/27/17 1347  Weight: 156 lb 6.4 oz (70.9 kg)   General: Well-nourished, well-appearing female in no acute distress.  She is unaccompanied today.   HEENT: Head is normocephalic.  Pupils equal and reactive to light. Conjunctivae clear without exudate.  Sclerae anicteric. Oral mucosa is pink, moist.  Oropharynx is pink without lesions or erythema.  Lymph: No cervical, supraclavicular, or infraclavicular lymphadenopathy noted on palpation.  Cardiovascular: Regular rate and rhythm.Marland Kitchen Respiratory: Clear to auscultation bilaterally. Chest expansion symmetric; breathing non-labored.  Breast: right  breast s/p lumpectomy, healing well, no nodularity, mass or concern for recurrence, left breast without nodules masses skin or nipple changes. GI: Abdomen soft and round; non-tender, non-distended. Bowel sounds normoactive.  GU: Deferred.  Neuro: No focal deficits. Steady gait.  Psych: Mood and affect normal and appropriate for situation.  Extremities: No edema. MSK: No focal spinal tenderness to palpation.  Full range of motion in bilateral upper extremities Skin: Warm and dry.  LABORATORY DATA:  None for this visit.  DIAGNOSTIC IMAGING:  None for this visit.      ASSESSMENT AND PLAN:  Ms.. Cunningham is a pleasant 51 y.o. female with Stage IIA right breast invasive ductal carcinoma, ER-/PR-/HER2-, diagnosed in 01/2016, treated with neo adjuvant chemotherapy, lumpectomy, and adjuvant radiation therapy.  She presents to the Survivorship Clinic for our initial meeting and routine follow-up post-completion of treatment for breast cancer.    1. Stage IIA right breast cancer:  Elizabeth Cunningham is continuing to recover from definitive treatment for breast cancer. She will follow-up with her medical oncologist, Dr. [name] in June, 2018 with history and physical exam per surveillance protocol.  Today, a comprehensive survivorship care plan and treatment summary  was reviewed with the patient today detailing her breast cancer diagnosis, treatment course, potential late/long-term effects of treatment, appropriate follow-up care with recommendations for the future, and patient education resources.  A copy of this summary, along with a letter will be sent to the patient's primary care provider via mail/fax/In Basket message after today's visit.    2. Globus sensation in throat: She has the feeling like something is in her throat and this has been present for about 2 months.  I recommended she see ENT and have them look into her throat and evaluate her completely.    3. Bone health:  Given Elizabeth Cunningham  age/history of breast cancer, she is at risk for bone demineralization.  She does need a DEXA now that she is post-menopausal.  However, this can happen whenever her next mammogram is done, since she isn't taking anything that would worsen her bone density level.  In the meantime, she was encouraged to increase her consumption of foods rich in calcium, as well as increase her weight-bearing activities.  She was given education on specific activities to promote bone health.  4. Cancer screening:  Due to Elizabeth Cunningham history and her age, she should receive screening for skin cancers, colon cancer, and gynecologic cancers.  The information and recommendations are listed on the patient's comprehensive care plan/treatment summary and were reviewed in detail with the patient.    5. Health maintenance and wellness promotion: Elizabeth Cunningham was encouraged to consume 5-7 servings of fruits and vegetables per day. We reviewed the "Nutrition Rainbow" handout, as well as the handout "Take Control of Your Health and Reduce Your Cancer Risk" from the [employer].  She was also encouraged to engage in moderate to vigorous exercise for 30 minutes per day most days of the week. We discussed the [employer] fitness program, which is designed for cancer survivors to help them become more physically fit after cancer treatments.  She was instructed to limit her alcohol consumption and continue to abstain from tobacco use.     6. Support services/counseling: It is not uncommon for this period of the patient's cancer care trajectory to be one of many emotions and stressors.  We discussed an opportunity for her to participate in the next session of [facility] ("Finding Your New Normal") support group series designed for patients after they have completed treatment.   Elizabeth Cunningham was encouraged to take advantage of our many other support services programs, support groups, and/or counseling in coping with her new life as a  cancer survivor after completing anti-cancer treatment.  She was offered support today through active listening and expressive supportive counseling.  She was given information regarding our available services and encouraged to contact me with any questions or for help enrolling in any of our support group/programs.    Dispo:   -Return to cancer center in 02/2018 for follow up with Dr. [name]  -Mammogram due in 02/2018 -Follow up with Dr. Brantley Stage at [facility] in 08/2017 -She is welcome to return back to the Survivorship Clinic at any time; no additional follow-up needed at this time.  -Consider referral back to survivorship as a long-term survivor for continued surveillance  A total of (60) minutes of face-to-face time was spent with this patient with greater than 50% of that time in counseling and care-coordination.   Gardenia Phlegm, NP Survivorship Program [facility] [phone]   Note: PRIMARY CARE PROVIDER Vernie Shanks, [place] [phone]

## 2017-06-28 ENCOUNTER — Telehealth: Payer: Self-pay | Admitting: Adult Health

## 2017-06-28 NOTE — Telephone Encounter (Signed)
No 10/11 los. °

## 2017-07-11 DIAGNOSIS — R1313 Dysphagia, pharyngeal phase: Secondary | ICD-10-CM | POA: Diagnosis not present

## 2017-07-15 DIAGNOSIS — Z1211 Encounter for screening for malignant neoplasm of colon: Secondary | ICD-10-CM | POA: Diagnosis not present

## 2017-07-15 DIAGNOSIS — K635 Polyp of colon: Secondary | ICD-10-CM | POA: Diagnosis not present

## 2017-07-15 DIAGNOSIS — D126 Benign neoplasm of colon, unspecified: Secondary | ICD-10-CM | POA: Diagnosis not present

## 2017-07-15 DIAGNOSIS — K5730 Diverticulosis of large intestine without perforation or abscess without bleeding: Secondary | ICD-10-CM | POA: Diagnosis not present

## 2017-07-18 DIAGNOSIS — D126 Benign neoplasm of colon, unspecified: Secondary | ICD-10-CM | POA: Diagnosis not present

## 2017-07-18 DIAGNOSIS — K635 Polyp of colon: Secondary | ICD-10-CM | POA: Diagnosis not present

## 2017-07-18 DIAGNOSIS — Z1211 Encounter for screening for malignant neoplasm of colon: Secondary | ICD-10-CM | POA: Diagnosis not present

## 2017-08-25 IMAGING — MG MM DIAG BREAST TOMO UNI RIGHT
8 of 13 series · 8 of 29 positions shown · non-contrast
Comparison: 10/10/2015

CLINICAL DATA: Palpable abnormality in the right breast.

EXAM:
2D DIGITAL DIAGNOSTIC BILATERAL MAMMOGRAM WITH ADJUNCT TOMO
ULTRASOUND RIGHT BREAST

[R TAN]
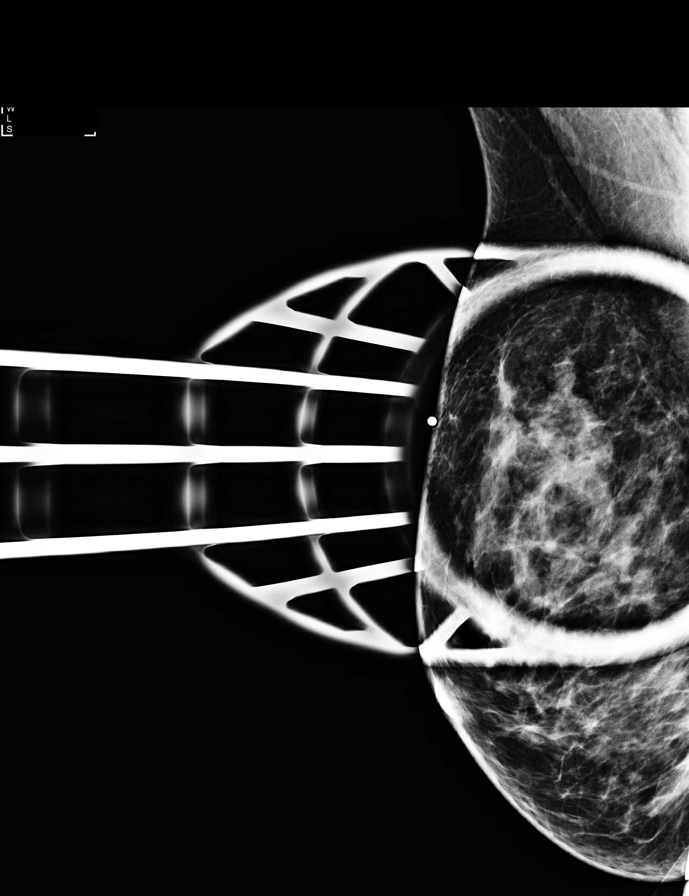

[R MLO synth-2D]
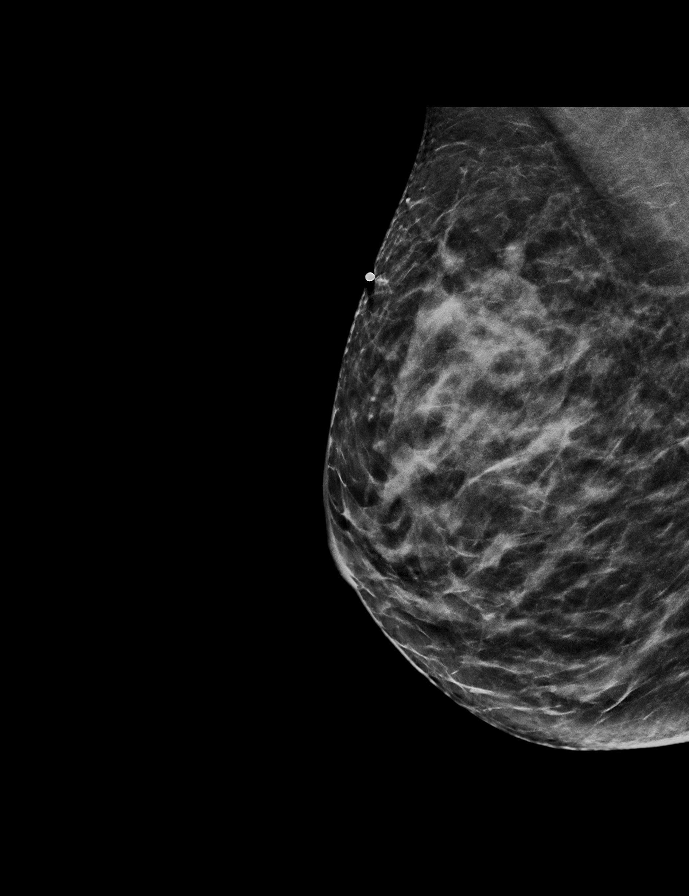

[R MLO]
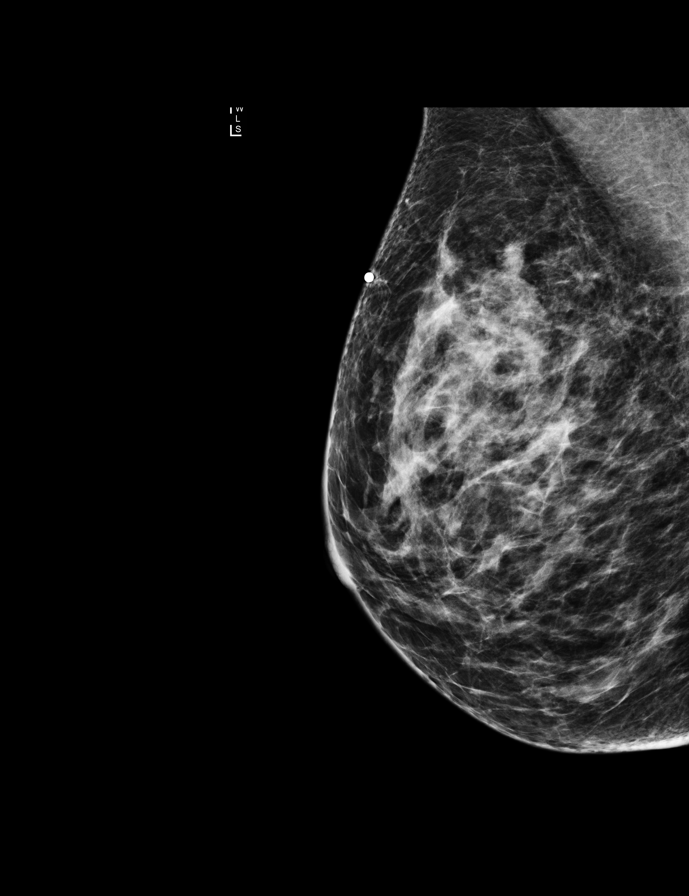

[R CC]
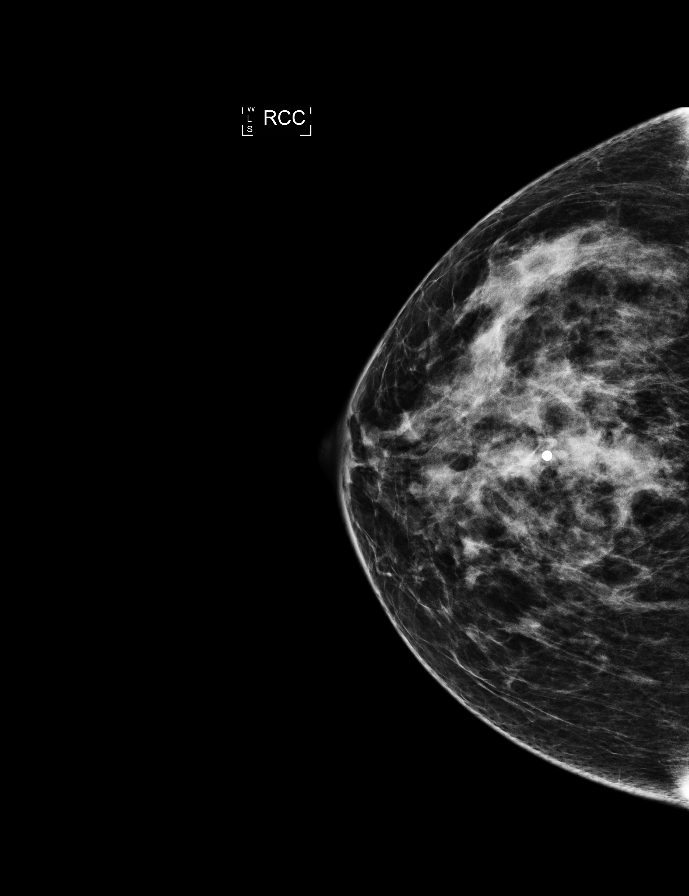

[L CC synth-2D]
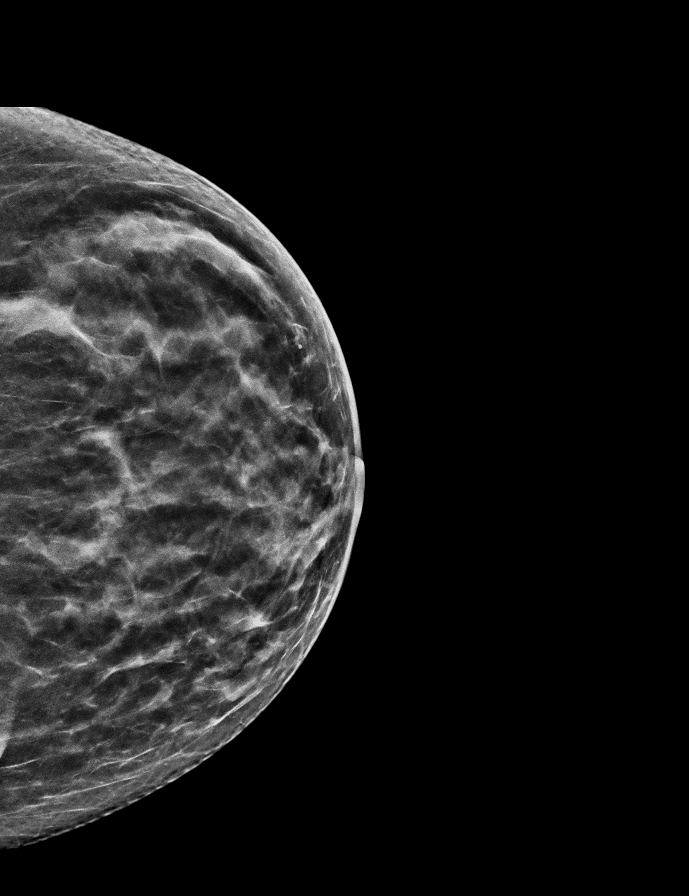

[R CC synth-2D]
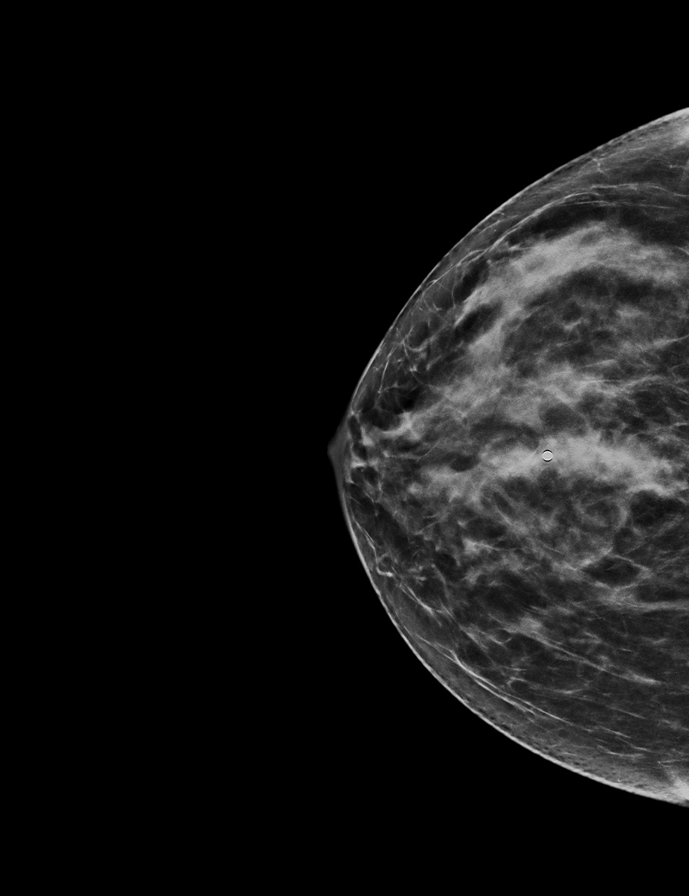

[L MLO]
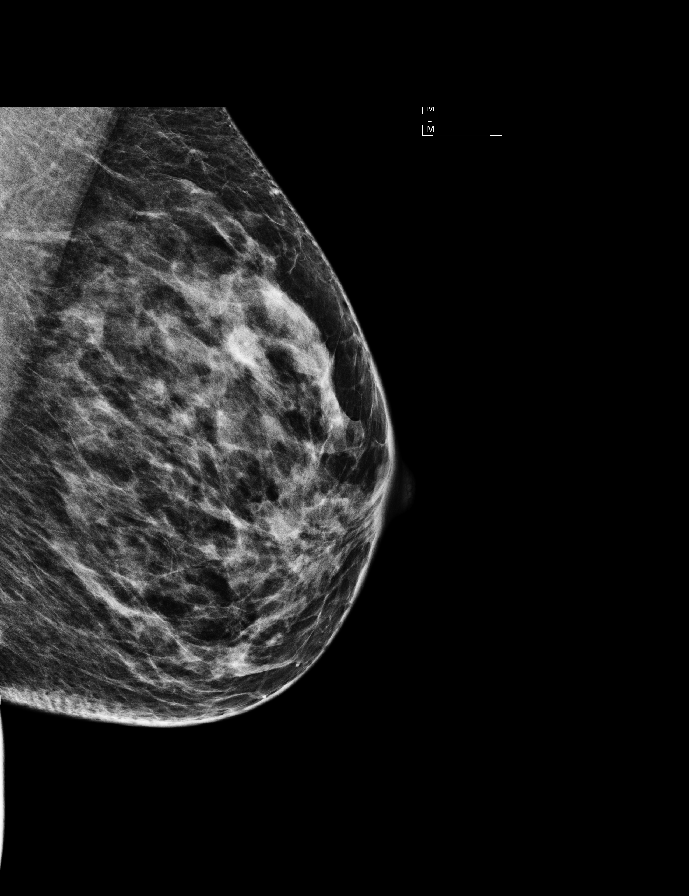

[L MLO synth-2D]
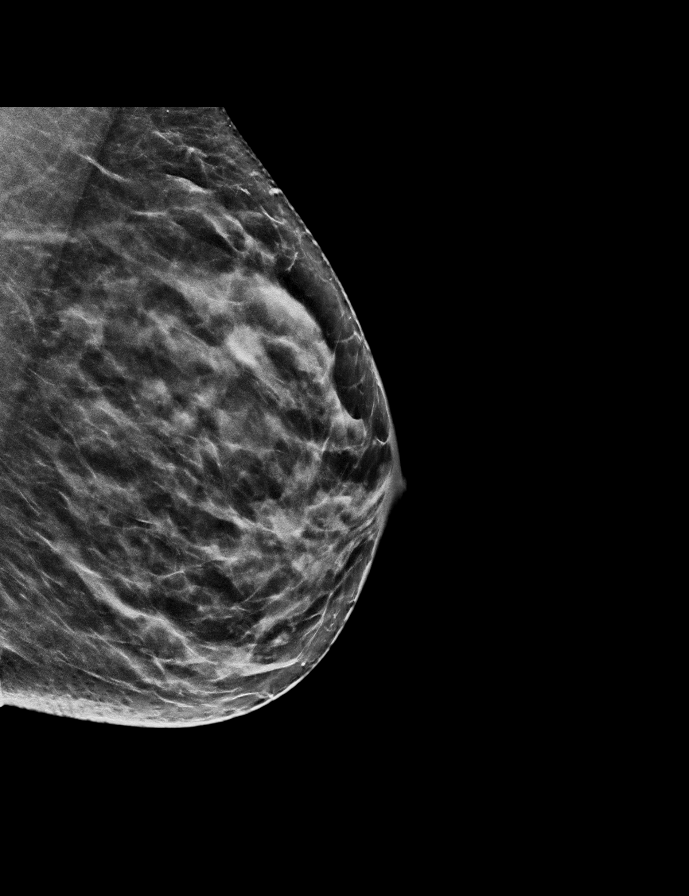

[8 of 29 positions shown; findings below may reference images not displayed]

ACR Breast Density Category c: The breast tissue is heterogeneously
dense, which may obscure small masses.
FINDINGS: A BB is placed in the area of patient's concern in the upper central
portion of the right breast. Within this region there is irregular
mass, partially obscured and further evaluated with ultrasound. Left
breast is negative.

On physical exam, I palpate a mass in the 12-12 30 o'clock location
of the right breast, approximately 3 cm in diameter on physical
exam.

Targeted ultrasound is performed, showing an irregular hypoechoic
mass in the 12:30 o'clock location of the right breast measuring
x 1.9 by 1.6 cm. Mass is taller than wide and contain significant
vascularity on Doppler evaluation. Evaluation of the axilla is
negative for adenopathy.
IMPRESSION: Suspicious mass in the 12:30 o'clock location of the right breast.
Ultrasound-guided core biopsy is recommended. No evidence for
adenopathy.

RECOMMENDATION:
Biopsy is scheduled for the patient on 01/20/2016.

I have discussed the findings and recommendations with the patient.
Results were also provided in writing at the conclusion of the
visit. If applicable, a reminder letter will be sent to the patient
regarding the next appointment.

BI-RADS CATEGORY  4: Suspicious.

## 2017-12-06 DIAGNOSIS — Z853 Personal history of malignant neoplasm of breast: Secondary | ICD-10-CM | POA: Diagnosis not present

## 2018-02-17 ENCOUNTER — Telehealth: Payer: Self-pay | Admitting: Hematology and Oncology

## 2018-02-17 ENCOUNTER — Other Ambulatory Visit: Payer: Self-pay | Admitting: Hematology and Oncology

## 2018-02-17 DIAGNOSIS — Z853 Personal history of malignant neoplasm of breast: Secondary | ICD-10-CM

## 2018-02-17 NOTE — Telephone Encounter (Signed)
Patient called to reschedule  °

## 2018-02-24 ENCOUNTER — Ambulatory Visit: Payer: Managed Care, Other (non HMO) | Admitting: Hematology and Oncology

## 2018-03-05 ENCOUNTER — Inpatient Hospital Stay: Payer: BLUE CROSS/BLUE SHIELD | Admitting: Hematology and Oncology

## 2018-03-10 ENCOUNTER — Ambulatory Visit
Admission: RE | Admit: 2018-03-10 | Discharge: 2018-03-10 | Disposition: A | Discharge status: Home / Self Care | Payer: BLUE CROSS/BLUE SHIELD | Source: Ambulatory Visit | Attending: Hematology and Oncology | Admitting: Hematology and Oncology

## 2018-03-10 DIAGNOSIS — R922 Inconclusive mammogram: Secondary | ICD-10-CM | POA: Diagnosis not present

## 2018-03-10 DIAGNOSIS — Z853 Personal history of malignant neoplasm of breast: Secondary | ICD-10-CM

## 2018-03-13 ENCOUNTER — Inpatient Hospital Stay: Payer: BLUE CROSS/BLUE SHIELD | Admitting: Hematology and Oncology

## 2018-03-18 ENCOUNTER — Telehealth: Payer: Self-pay

## 2018-03-18 ENCOUNTER — Ambulatory Visit: Payer: BLUE CROSS/BLUE SHIELD | Admitting: Hematology and Oncology
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
## 2018-03-18 NOTE — Assessment & Plan Note (Deleted)
Right breast biopsy 01/20/2016 12:00: Invasive ductal carcinoma, grade 3, lymphovascular invasion is present, ER 0%, PR 0%, Ki-67 95%, HER-2 negative ratio 1.19; mamm and [place] irregular mass 2.9 x 1.9 x 1.6 cm, no axillary LN T2 N0 stage IIA clinical stage  Treatment Summary 1. Neoadjuvant chemotherapy with Adriamycin and Cytoxan dose dense 4 followed by Abraxane weekly 12 started 02/16/2016 completed 06/27/2016 2. Followed by Lumpectomy 08/14/16: Path CR 0/8 LN neg 3. Genetic counseling and testing on 03/26/2016: Normal;no mutations identified 4. Adjuvant radiation therapy completed 11/14/2016  Plan: surveillance 1. Breast exam 03/18/2018: Benign 2. mammograms 03/10/2018: Benign  Return to clinic in 1 year for follow-up

## 2018-03-24 ENCOUNTER — Inpatient Hospital Stay: Payer: BLUE CROSS/BLUE SHIELD | Attending: Hematology and Oncology | Admitting: Hematology and Oncology

## 2018-03-24 ENCOUNTER — Telehealth: Payer: Self-pay | Admitting: Hematology and Oncology

## 2018-03-24 DIAGNOSIS — C50211 Malignant neoplasm of upper-inner quadrant of right female breast: Secondary | ICD-10-CM | POA: Insufficient documentation

## 2018-03-24 DIAGNOSIS — Z171 Estrogen receptor negative status [ER-]: Secondary | ICD-10-CM | POA: Diagnosis not present

## 2018-03-24 DIAGNOSIS — Z79899 Other long term (current) drug therapy: Secondary | ICD-10-CM | POA: Diagnosis not present

## 2018-03-24 DIAGNOSIS — Z923 Personal history of irradiation: Secondary | ICD-10-CM | POA: Diagnosis not present

## 2018-03-24 NOTE — Progress Notes (Signed)
Patient Care Team: Vernie Shanks, MD as PCP - General (Family Medicine) Delice Bison, Charlestine Massed, NP as Nurse Practitioner (Hematology and Oncology) Kyung Rudd, MD as Consulting Physician (Radiation Oncology) Nicholas Lose, MD as Consulting Physician (Hematology and Oncology) Bensimhon, Shaune Pascal, MD as Consulting Physician (Cardiology) Erroll Luna, MD as Consulting Physician (General Surgery)  DIAGNOSIS:  Encounter Diagnosis  Name Primary?  . Malignant neoplasm of upper-inner quadrant of right breast in female, estrogen receptor negative ([place])     SUMMARY OF ONCOLOGIC HISTORY:   Breast cancer of upper-inner quadrant of right female breast ([place])   01/20/2016 Initial Diagnosis    Right breast biopsy 12:00: Invasive ductal carcinoma, grade 3, lymphovascular invasion is present, ER 0%, PR 0%, Ki-67 95%, HER-2 negative ratio 1.19; mamm and [place] irregular mass 2.9 x 1.9 x 1.6 cm, no axillary LN T2 N0 stage IIA clinical stage      02/16/2016 - 06/27/2016 Neo-Adjuvant Chemotherapy    Dose dense Adriamycin and Cytoxan 4 followed by Abraxane weekly 12      05/04/2016 Miscellaneous    Genetic testing was normal      06/29/2016 Breast MRI    Complete radiologic response      08/15/2016 Surgery    Rt Lumpectomy: No malignancy 0/8 LN neg, Path CR      09/27/2016 - 11/14/2016 Radiation Therapy    Adjuvant radiation therapy       CHIEF COMPLIANT: Surveillance of breast cancer  INTERVAL HISTORY: Elizabeth Cunningham is a 52 year old with above-mentioned history of triple negative breast cancer underwent neoadjuvant chemotherapy with a complete pathologic response.  She finished radiation therapy and is currently in surveillance.  She reports no new problems or concerns.  REVIEW OF SYSTEMS:   Constitutional: Denies fevers, chills or abnormal weight loss Eyes: Denies blurriness of vision Ears, nose, mouth, throat, and face: Denies mucositis or sore throat Respiratory: Denies  cough, dyspnea or wheezes Cardiovascular: Denies palpitation, chest discomfort Gastrointestinal:  Denies nausea, heartburn or change in bowel habits Skin: Denies abnormal skin rashes Lymphatics: Denies new lymphadenopathy or easy bruising Neurological:Denies numbness, tingling or new weaknesses Behavioral/Psych: Mood is stable, no new changes  Extremities: No lower extremity edema  All other systems were reviewed with the patient and are negative.  I have reviewed the past medical history, past surgical history, social history and family history with the patient and they are unchanged from previous note.  ALLERGIES:  has No Known Allergies.  MEDICATIONS:  Current Outpatient Medications  Medication Sig Dispense Refill  . Coenzyme Q10 (CO Q 10 PO) Take 1 capsule by mouth daily. Reported on 02/08/2016    . ibuprofen (ADVIL,MOTRIN) 200 MG tablet Take 400 mg by mouth every 6 (six) hours as needed for headache or mild pain.    Marland Kitchen losartan (COZAAR) 25 MG tablet     . Multiple Vitamins-Minerals (MULTIVITAMIN WITH MINERALS) tablet Take 1 tablet by mouth daily.     No current facility-administered medications for this visit.     PHYSICAL EXAMINATION: ECOG PERFORMANCE STATUS: 1 - Symptomatic but completely ambulatory  Vitals:   03/24/18 1524  BP: 128/74  Pulse: 71  Resp: 18  Temp: 98.7 F (37.1 C)  SpO2: 100%   Filed Weights   03/24/18 1524  Weight: 162 lb 3.4 oz (73.6 kg)    GENERAL:alert, no distress and comfortable SKIN: skin color, texture, turgor are normal, no rashes or significant lesions EYES: normal, Conjunctiva are pink and non-injected, sclera clear OROPHARYNX:no exudate, no erythema  and lips, buccal mucosa, and tongue normal  NECK: supple, thyroid normal size, non-tender, without nodularity LYMPH:  no palpable lymphadenopathy in the cervical, axillary or inguinal LUNGS: clear to auscultation and percussion with normal breathing effort HEART: regular rate & rhythm and  no murmurs and no lower extremity edema ABDOMEN:abdomen soft, non-tender and normal bowel sounds MUSCULOSKELETAL:no cyanosis of digits and no clubbing  NEURO: alert & oriented x 3 with fluent speech, no focal motor/sensory deficits EXTREMITIES: No lower extremity edema BREAST: No palpable masses or nodules in either right or left breasts. No palpable axillary supraclavicular or infraclavicular adenopathy no breast tenderness or nipple discharge. (exam performed in the presence of a chaperone)  LABORATORY DATA:  I have reviewed the data as listed CMP Latest Ref Rng & Units 09/25/2016 08/06/2016 06/27/2016  Glucose 65 - 99 mg/dL 94 103 105  BUN 6 - 20 mg/dL 10 10.7 8.2  Creatinine 0.44 - 1.00 mg/dL 0.65 0.7 0.7  Sodium 135 - 145 mmol/L 138 141 139  Potassium 3.5 - 5.1 mmol/L 3.8 3.9 4.3  Chloride 101 - 111 mmol/L 99(L) - -  CO2 22 - 32 mmol/L '27 27 25  ' Calcium 8.9 - 10.3 mg/dL 10.2 10.0 9.5  Total Protein 6.4 - 8.3 g/dL - 8.1 7.2  Total Bilirubin 0.20 - 1.20 mg/dL - 0.56 0.49  Alkaline Phos 40 - 150 U/L - 79 63  AST 5 - 34 U/L - 15 16  ALT 0 - 55 U/L - 20 18    Lab Results  Component Value Date   WBC 6.3 09/25/2016   HGB 13.0 09/25/2016   HCT 37.5 09/25/2016   MCV 90.4 09/25/2016   PLT 294 09/25/2016   NEUTROABS 4.2 08/06/2016    ASSESSMENT & PLAN:  Breast cancer of upper-inner quadrant of right female breast ([place]) Right breast biopsy 01/20/2016 12:00: Invasive ductal carcinoma, grade 3, lymphovascular invasion is present, ER 0%, PR 0%, Ki-67 95%, HER-2 negative ratio 1.19; mamm and [place] irregular mass 2.9 x 1.9 x 1.6 cm, no axillary LN T2 N0 stage IIA clinical stage  Treatment Summary 1. Neoadjuvant chemotherapy with Adriamycin and Cytoxan dose dense 4 followed by Abraxane weekly 12 started 02/16/2016 completed 06/27/2016 2. Followed by Lumpectomy 08/14/16: Path CR 0/8 LN neg 3. Genetic counseling and testing on 03/26/2016: Normal;no mutations identified 4. Adjuvant  radiation therapy completed 11/14/2016  Plan: surveillance 1. Breast exam 03/24/2018: Benign 2. mammograms 03/10/2018: Benign  Patient is planning to go on vacation to [place]. She has traveled to [place] 2-3 times because her daughter is living there.  Return to clinic in 1 year for follow-up    No orders of the defined types were placed in this encounter.  The patient has a good understanding of the overall plan. she agrees with it. she will call with any problems that may develop before the next visit here.   Harriette Ohara, MD 03/24/18

## 2018-03-24 NOTE — Telephone Encounter (Signed)
Scheduled appt per 7/8 los - pt did not want avs or calendar.

## 2018-03-24 NOTE — Assessment & Plan Note (Signed)
Right breast biopsy 01/20/2016 12:00: Invasive ductal carcinoma, grade 3, lymphovascular invasion is present, ER 0%, PR 0%, Ki-67 95%, HER-2 negative ratio 1.19; mamm and [place] irregular mass 2.9 x 1.9 x 1.6 cm, no axillary LN T2 N0 stage IIA clinical stage  Treatment Summary 1. Neoadjuvant chemotherapy with Adriamycin and Cytoxan dose dense 4 followed by Abraxane weekly 12 started 02/16/2016 completed 06/27/2016 2. Followed by Lumpectomy 08/14/16: Path CR 0/8 LN neg 3. Genetic counseling and testing on 03/26/2016: Normal;no mutations identified 4. Adjuvant radiation therapy completed 11/14/2016  Plan: surveillance 1. Breast exam 03/24/2018: Benign 2. mammograms 03/10/2018: Benign  Return to clinic in 1 year for follow-up

## 2018-10-17 ENCOUNTER — Other Ambulatory Visit: Payer: Self-pay | Admitting: Nurse Practitioner

## 2019-02-24 ENCOUNTER — Other Ambulatory Visit: Payer: Self-pay | Admitting: Hematology and Oncology

## 2019-02-24 DIAGNOSIS — Z853 Personal history of malignant neoplasm of breast: Secondary | ICD-10-CM

## 2019-03-19 ENCOUNTER — Telehealth: Payer: Self-pay | Admitting: Hematology and Oncology

## 2019-03-19 NOTE — Telephone Encounter (Signed)
I talk with patient regarding video visit °

## 2019-03-19 NOTE — Assessment & Plan Note (Signed)
Right breast biopsy 01/20/2016 12:00: Invasive ductal carcinoma, grade 3, lymphovascular invasion is present, ER 0%, PR 0%, Ki-67 95%, HER-2 negative ratio 1.19; mamm and [place] irregular mass 2.9 x 1.9 x 1.6 cm, no axillary LN T2 N0 stage IIA clinical stage  Treatment Summary 1. Neoadjuvant chemotherapy with Adriamycin and Cytoxan dose dense 4 followed by Abraxane weekly 12 started 02/16/2016 completed 06/27/2016 2. Followed by Lumpectomy 08/14/16: Path CR 0/8 LN neg 3. Genetic counseling and testing on 03/26/2016: Normal;no mutations identified 4. Adjuvant radiation therapy completed 11/14/2016  Plan: surveillance 1. Breast exam 03/24/2018: Benign 2. Mammograms 03/23/19: Benign  Patient is planning to go on vacation to [place]. She has traveled to [place] 2-3 times because her daughter is living there.  Return to clinic in 1 year for follow-up

## 2019-03-23 ENCOUNTER — Ambulatory Visit
Admission: RE | Admit: 2019-03-23 | Discharge: 2019-03-23 | Disposition: A | Discharge status: Home / Self Care | Payer: Commercial Managed Care - PPO | Source: Ambulatory Visit | Attending: Hematology and Oncology | Admitting: Hematology and Oncology

## 2019-03-23 ENCOUNTER — Other Ambulatory Visit: Payer: Self-pay | Admitting: Hematology and Oncology

## 2019-03-23 ENCOUNTER — Other Ambulatory Visit: Payer: Self-pay

## 2019-03-23 DIAGNOSIS — Z853 Personal history of malignant neoplasm of breast: Secondary | ICD-10-CM

## 2019-03-24 ENCOUNTER — Telehealth: Payer: Self-pay | Admitting: Hematology and Oncology

## 2019-03-24 NOTE — Telephone Encounter (Signed)
Contacted patient to verify mychart visit for pre reg 

## 2019-03-24 NOTE — Progress Notes (Signed)
HEMATOLOGY-ONCOLOGY MYCHART VIDEO VISIT PROGRESS NOTE  I connected with Elizabeth Cunningham on 03/25/2019 at  3:15 PM EDT by MyChart video conference and verified that I am speaking with the correct person using two identifiers.  I discussed the limitations, risks, security and privacy concerns of performing an evaluation and management service by MyChart and the availability of in person appointments.  I also discussed with the patient that there may be a patient responsible charge related to this service. The patient expressed understanding and agreed to proceed.  Patient's Location: Home Physician Location: Clinic  CHIEF COMPLIANT: Surveillance of breast cancer  INTERVAL HISTORY: Elizabeth Cunningham is a 53 y.o. female with above-mentioned history of triple negative breast cancer treated with neoadjuvant chemotherapy with a complete pathologic response, radiation, and who is currently on surveillance. I last saw her a year ago. Mammogram on 03/23/19 showed new likely benign calcifications at the lumpectomy site and no evidence of malignancy in the left breast. She presents over Mychart for annual follow-up.   Oncology History  Breast cancer of upper-inner quadrant of right female breast ([place])  01/20/2016 Initial Diagnosis   Right breast biopsy 12:00: Invasive ductal carcinoma, grade 3, lymphovascular invasion is present, ER 0%, PR 0%, Ki-67 95%, HER-2 negative ratio 1.19; mamm and [place] irregular mass 2.9 x 1.9 x 1.6 cm, no axillary LN T2 N0 stage IIA clinical stage   02/16/2016 - 06/27/2016 Neo-Adjuvant Chemotherapy   Dose dense Adriamycin and Cytoxan 4 followed by Abraxane weekly 12   05/04/2016 Miscellaneous   Genetic testing was normal   06/29/2016 Breast MRI   Complete radiologic response   08/15/2016 Surgery   Rt Lumpectomy: No malignancy 0/8 LN neg, Path CR   09/27/2016 - 11/14/2016 Radiation Therapy   Adjuvant radiation therapy     REVIEW OF SYSTEMS:   Constitutional: Denies  fevers, chills or abnormal weight loss Eyes: Denies blurriness of vision Ears, nose, mouth, throat, and face: Denies mucositis or sore throat Respiratory: Denies cough, dyspnea or wheezes Cardiovascular: Denies palpitation, chest discomfort Gastrointestinal:  Denies nausea, heartburn or change in bowel habits Skin: Denies abnormal skin rashes Lymphatics: Denies new lymphadenopathy or easy bruising Neurological:Denies numbness, tingling or new weaknesses Behavioral/Psych: Mood is stable, no new changes  Extremities: No lower extremity edema Breast: denies any pain or lumps or nodules in either breasts All other systems were reviewed with the patient and are negative.  Observations/Objective:  There were no vitals filed for this visit. There is no height or weight on file to calculate BMI.  I have reviewed the data as listed CMP Latest Ref Rng & Units 09/25/2016 08/06/2016 06/27/2016  Glucose 65 - 99 mg/dL 94 103 105  BUN 6 - 20 mg/dL 10 10.7 8.2  Creatinine 0.44 - 1.00 mg/dL 0.65 0.7 0.7  Sodium 135 - 145 mmol/L 138 141 139  Potassium 3.5 - 5.1 mmol/L 3.8 3.9 4.3  Chloride 101 - 111 mmol/L 99(L) - -  CO2 22 - 32 mmol/L '27 27 25  ' Calcium 8.9 - 10.3 mg/dL 10.2 10.0 9.5  Total Protein 6.4 - 8.3 g/dL - 8.1 7.2  Total Bilirubin 0.20 - 1.20 mg/dL - 0.56 0.49  Alkaline Phos 40 - 150 U/L - 79 63  AST 5 - 34 U/L - 15 16  ALT 0 - 55 U/L - 20 18    Lab Results  Component Value Date   WBC 6.3 09/25/2016   HGB 13.0 09/25/2016   HCT 37.5 09/25/2016   MCV 90.4  09/25/2016   PLT 294 09/25/2016   NEUTROABS 4.2 08/06/2016      Assessment Plan:  Breast cancer of upper-inner quadrant of right female breast ([place]) Right breast biopsy 01/20/2016 12:00: Invasive ductal carcinoma, grade 3, lymphovascular invasion is present, ER 0%, PR 0%, Ki-67 95%, HER-2 negative ratio 1.19; mamm and [place] irregular mass 2.9 x 1.9 x 1.6 cm, no axillary LN T2 N0 stage IIA clinical stage  Treatment Summary 1.  Neoadjuvant chemotherapy with Adriamycin and Cytoxan dose dense 4 followed by Abraxane weekly 12 started 02/16/2016 completed 06/27/2016 2. Followed by Lumpectomy 08/14/16: Path CR 0/8 LN neg 3. Genetic counseling and testing on 03/26/2016: Normal;no mutations identified 4. Adjuvant radiation therapy completed 11/14/2016  Plan: surveillance 1. Breast exam 03/24/2018: Benign 2. Mammograms 03/23/19: Benign  Patient is planning to go on vacation to [place]. She has traveled to [place] 2-3 times because her daughter is living there.  Return to clinic in 1 year for follow-up   I discussed the assessment and treatment plan with the patient. The patient was provided an opportunity to ask questions and all were answered. The patient agreed with the plan and demonstrated an understanding of the instructions. The patient was advised to call back or seek an in-person evaluation if the symptoms worsen or if the condition fails to improve as anticipated.   I provided 15 minutes of face-to-face MyChart video visit time during this encounter.    Rulon Eisenmenger, MD 03/25/2019  I, Molly Dorshimer, am acting as scribe for Nicholas Lose, MD.  I have reviewed the above documentation for accuracy and completeness, and I agree with the above.

## 2019-03-25 ENCOUNTER — Inpatient Hospital Stay: Payer: Commercial Managed Care - PPO | Attending: Hematology and Oncology | Admitting: Hematology and Oncology

## 2019-03-25 DIAGNOSIS — Z9221 Personal history of antineoplastic chemotherapy: Secondary | ICD-10-CM

## 2019-03-25 DIAGNOSIS — Z171 Estrogen receptor negative status [ER-]: Secondary | ICD-10-CM

## 2019-03-25 DIAGNOSIS — Z923 Personal history of irradiation: Secondary | ICD-10-CM | POA: Diagnosis not present

## 2019-03-25 DIAGNOSIS — C50211 Malignant neoplasm of upper-inner quadrant of right female breast: Secondary | ICD-10-CM

## 2019-03-26 ENCOUNTER — Telehealth: Payer: Self-pay | Admitting: Hematology and Oncology

## 2019-03-26 NOTE — Telephone Encounter (Signed)
I talk with pateint regarding schedule

## 2019-09-24 ENCOUNTER — Other Ambulatory Visit: Payer: Self-pay | Admitting: Hematology and Oncology

## 2019-09-24 ENCOUNTER — Other Ambulatory Visit: Payer: Self-pay

## 2019-09-24 ENCOUNTER — Ambulatory Visit
Admission: RE | Admit: 2019-09-24 | Discharge: 2019-09-24 | Disposition: A | Discharge status: Home / Self Care | Payer: Commercial Managed Care - PPO | Source: Ambulatory Visit | Attending: Hematology and Oncology | Admitting: Hematology and Oncology

## 2019-09-24 DIAGNOSIS — R921 Mammographic calcification found on diagnostic imaging of breast: Secondary | ICD-10-CM

## 2019-09-24 DIAGNOSIS — Z853 Personal history of malignant neoplasm of breast: Secondary | ICD-10-CM

## 2019-09-24 HISTORY — DX: Personal history of irradiation: Z92.3

## 2019-09-24 HISTORY — DX: Personal history of antineoplastic chemotherapy: Z92.21

## 2020-03-23 NOTE — Assessment & Plan Note (Deleted)
Right breast biopsy 01/20/2016 12:00: Invasive ductal carcinoma, grade 3, lymphovascular invasion is present, ER 0%, PR 0%, Ki-67 95%, HER-2 negative ratio 1.19; mamm and [place] irregular mass 2.9 x 1.9 x 1.6 cm, no axillary LN T2 N0 stage IIA clinical stage  Treatment Summary 1. Neoadjuvant chemotherapy with Adriamycin and Cytoxan dose dense 4 followed by Abraxane weekly 12 started 02/16/2016 completed 06/27/2016 2. Followed by Lumpectomy 08/14/16: Path CR 0/8 LN neg 3. Genetic counseling and testing on 03/26/2016: Normal;no mutations identified 4. Adjuvant radiation therapy completed 11/14/2016  Plan: surveillance 1. Breast exam 03/23/2020: Benign 2. Mammograms  03/24/2020: Benign  Patient is planning to go on vacation to [place]. She has traveled to [place] 2-3 times because her daughter is living there.  Return to clinic in 1 year for follow-up

## 2020-03-24 ENCOUNTER — Ambulatory Visit: Payer: Commercial Managed Care - PPO | Admitting: Hematology and Oncology

## 2020-03-24 ENCOUNTER — Other Ambulatory Visit: Payer: Self-pay

## 2020-03-24 ENCOUNTER — Ambulatory Visit
Admission: RE | Admit: 2020-03-24 | Discharge: 2020-03-24 | Disposition: A | Discharge status: Home / Self Care | Payer: Commercial Managed Care - PPO | Source: Ambulatory Visit | Attending: Hematology and Oncology | Admitting: Hematology and Oncology

## 2020-03-24 DIAGNOSIS — Z853 Personal history of malignant neoplasm of breast: Secondary | ICD-10-CM

## 2020-03-24 DIAGNOSIS — R921 Mammographic calcification found on diagnostic imaging of breast: Secondary | ICD-10-CM

## 2020-04-06 NOTE — Assessment & Plan Note (Deleted)
Right breast biopsy 01/20/2016 12:00: Invasive ductal carcinoma, grade 3, lymphovascular invasion is present, ER 0%, PR 0%, Ki-67 95%, HER-2 negative ratio 1.19; mamm and [place] irregular mass 2.9 x 1.9 x 1.6 cm, no axillary LN T2 N0 stage IIA clinical stage  Treatment Summary 1. Neoadjuvant chemotherapy with Adriamycin and Cytoxan dose dense 4 followed by Abraxane weekly 12 started 02/16/2016 completed 06/27/2016 2. Followed by Lumpectomy 08/14/16: Path CR 0/8 LN neg 3. Genetic counseling and testing on 03/26/2016: Normal;no mutations identified 4. Adjuvant radiation therapy completed 11/14/2016  Plan: surveillance 1. Breast exam 03/24/2018: Benign 2. Mammograms 03/23/19: Benign  Patient is planning to go on vacation to [place]. She has traveled to [place] 2-3 times because her daughter is living there.  Return to clinic in 1 year for follow-up

## 2020-04-07 ENCOUNTER — Inpatient Hospital Stay: Payer: Commercial Managed Care - PPO | Attending: Hematology and Oncology | Admitting: Hematology and Oncology

## 2021-03-24 ENCOUNTER — Other Ambulatory Visit: Payer: Self-pay | Admitting: Hematology and Oncology

## 2021-03-24 DIAGNOSIS — Z853 Personal history of malignant neoplasm of breast: Secondary | ICD-10-CM

## 2021-04-28 ENCOUNTER — Ambulatory Visit
Admission: RE | Admit: 2021-04-28 | Discharge: 2021-04-28 | Disposition: A | Discharge status: Home / Self Care | Payer: Commercial Managed Care - PPO | Source: Ambulatory Visit | Attending: Hematology and Oncology | Admitting: Hematology and Oncology

## 2021-04-28 ENCOUNTER — Other Ambulatory Visit: Payer: Self-pay

## 2021-04-28 DIAGNOSIS — Z853 Personal history of malignant neoplasm of breast: Secondary | ICD-10-CM

## 2022-04-13 ENCOUNTER — Other Ambulatory Visit: Payer: Self-pay | Admitting: Hematology and Oncology

## 2022-04-13 DIAGNOSIS — Z1231 Encounter for screening mammogram for malignant neoplasm of breast: Secondary | ICD-10-CM

## 2022-04-30 ENCOUNTER — Ambulatory Visit
Admission: RE | Admit: 2022-04-30 | Discharge: 2022-04-30 | Disposition: A | Discharge status: Home / Self Care | Payer: Commercial Managed Care - PPO | Source: Ambulatory Visit | Attending: Hematology and Oncology | Admitting: Hematology and Oncology

## 2022-04-30 DIAGNOSIS — Z1231 Encounter for screening mammogram for malignant neoplasm of breast: Secondary | ICD-10-CM

## 2023-05-24 ENCOUNTER — Other Ambulatory Visit: Payer: Self-pay

## 2023-05-24 DIAGNOSIS — Z1231 Encounter for screening mammogram for malignant neoplasm of breast: Secondary | ICD-10-CM

## 2023-06-05 ENCOUNTER — Ambulatory Visit: Payer: Commercial Managed Care - PPO

## 2023-09-02 ENCOUNTER — Ambulatory Visit
Admission: RE | Admit: 2023-09-02 | Discharge: 2023-09-02 | Disposition: A | Discharge status: Home / Self Care | Payer: Commercial Managed Care - PPO | Source: Ambulatory Visit

## 2023-09-02 DIAGNOSIS — Z1231 Encounter for screening mammogram for malignant neoplasm of breast: Secondary | ICD-10-CM

## 2024-08-27 ENCOUNTER — Other Ambulatory Visit: Payer: Self-pay

## 2024-08-27 DIAGNOSIS — Z1231 Encounter for screening mammogram for malignant neoplasm of breast: Secondary | ICD-10-CM

## 2024-09-08 ENCOUNTER — Ambulatory Visit
Admission: RE | Admit: 2024-09-08 | Discharge: 2024-09-08 | Disposition: A | Discharge status: Home / Self Care | Source: Ambulatory Visit

## 2024-09-08 DIAGNOSIS — Z1231 Encounter for screening mammogram for malignant neoplasm of breast: Secondary | ICD-10-CM
